# Patient Record
Sex: Male | Born: 1990 | Race: White | Hispanic: No | Marital: Single | Smoking: Never smoker
Health system: Southern US, Community
[De-identification: ages and names within clinical notes are randomized; demographics above are authoritative.]

## PROBLEM LIST (undated history)

## (undated) DIAGNOSIS — R51 Headache: Secondary | ICD-10-CM

## (undated) DIAGNOSIS — N200 Calculus of kidney: Secondary | ICD-10-CM

## (undated) DIAGNOSIS — R55 Syncope and collapse: Secondary | ICD-10-CM

## (undated) HISTORY — PX: NO PAST SURGERIES: SHX2092

## (undated) HISTORY — DX: Headache: R51

## (undated) HISTORY — DX: Syncope and collapse: R55

---
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
## 2013-12-12 ENCOUNTER — Emergency Department (HOSPITAL_COMMUNITY): Payer: Medicaid Other

## 2013-12-12 ENCOUNTER — Encounter (HOSPITAL_COMMUNITY): Payer: Self-pay | Admitting: Emergency Medicine

## 2013-12-12 ENCOUNTER — Emergency Department (HOSPITAL_COMMUNITY)
Admission: EM | Admit: 2013-12-12 | Discharge: 2013-12-13 | Disposition: A | Discharge status: Home / Self Care | Payer: Medicaid Other | Attending: Emergency Medicine | Admitting: Emergency Medicine

## 2013-12-12 DIAGNOSIS — R110 Nausea: Secondary | ICD-10-CM | POA: Insufficient documentation

## 2013-12-12 DIAGNOSIS — R519 Headache, unspecified: Secondary | ICD-10-CM

## 2013-12-12 DIAGNOSIS — R55 Syncope and collapse: Secondary | ICD-10-CM | POA: Insufficient documentation

## 2013-12-12 DIAGNOSIS — R5381 Other malaise: Secondary | ICD-10-CM | POA: Insufficient documentation

## 2013-12-12 DIAGNOSIS — R5383 Other fatigue: Secondary | ICD-10-CM

## 2013-12-12 DIAGNOSIS — R51 Headache: Secondary | ICD-10-CM | POA: Insufficient documentation

## 2013-12-12 DIAGNOSIS — Z87442 Personal history of urinary calculi: Secondary | ICD-10-CM | POA: Insufficient documentation

## 2013-12-12 HISTORY — DX: Calculus of kidney: N20.0

## 2013-12-12 LAB — COMPREHENSIVE METABOLIC PANEL
ALK PHOS: 54 U/L (ref 39–117)
ALT: 24 U/L (ref 0–53)
AST: 23 U/L (ref 0–37)
Albumin: 4.7 g/dL (ref 3.5–5.2)
BUN: 15 mg/dL (ref 6–23)
CO2: 27 meq/L (ref 19–32)
Calcium: 9.8 mg/dL (ref 8.4–10.5)
Chloride: 102 mEq/L (ref 96–112)
Creatinine, Ser: 0.85 mg/dL (ref 0.50–1.35)
GLUCOSE: 94 mg/dL (ref 70–99)
POTASSIUM: 4.1 meq/L (ref 3.7–5.3)
SODIUM: 142 meq/L (ref 137–147)
TOTAL PROTEIN: 7.9 g/dL (ref 6.0–8.3)
Total Bilirubin: 0.5 mg/dL (ref 0.3–1.2)

## 2013-12-12 LAB — CBC WITH DIFFERENTIAL/PLATELET
Basophils Absolute: 0 10*3/uL (ref 0.0–0.1)
Basophils Relative: 0 % (ref 0–1)
Eosinophils Absolute: 0 10*3/uL (ref 0.0–0.7)
Eosinophils Relative: 1 % (ref 0–5)
HCT: 44.7 % (ref 39.0–52.0)
Hemoglobin: 15.8 g/dL (ref 13.0–17.0)
LYMPHS ABS: 2.5 10*3/uL (ref 0.7–4.0)
LYMPHS PCT: 34 % (ref 12–46)
MCH: 30.9 pg (ref 26.0–34.0)
MCHC: 35.3 g/dL (ref 30.0–36.0)
MCV: 87.5 fL (ref 78.0–100.0)
Monocytes Absolute: 0.4 10*3/uL (ref 0.1–1.0)
Monocytes Relative: 5 % (ref 3–12)
NEUTROS PCT: 61 % (ref 43–77)
Neutro Abs: 4.5 10*3/uL (ref 1.7–7.7)
PLATELETS: 241 10*3/uL (ref 150–400)
RBC: 5.11 MIL/uL (ref 4.22–5.81)
RDW: 12.1 % (ref 11.5–15.5)
WBC: 7.4 10*3/uL (ref 4.0–10.5)

## 2013-12-12 LAB — URINALYSIS, ROUTINE W REFLEX MICROSCOPIC
Bilirubin Urine: NEGATIVE
GLUCOSE, UA: NEGATIVE mg/dL
HGB URINE DIPSTICK: NEGATIVE
Ketones, ur: NEGATIVE mg/dL
Leukocytes, UA: NEGATIVE
Nitrite: NEGATIVE
Protein, ur: NEGATIVE mg/dL
Specific Gravity, Urine: 1.023 (ref 1.005–1.030)
Urobilinogen, UA: 0.2 mg/dL (ref 0.0–1.0)
pH: 7.5 (ref 5.0–8.0)

## 2013-12-12 LAB — TROPONIN I: Troponin I: <0.3 ng/mL (ref <0.30)

## 2013-12-12 LAB — D-DIMER, QUANTITATIVE: D-Dimer, Quant: 0.3 ug/mL-FEU (ref 0.00–0.48)

## 2013-12-12 LAB — LIPASE, BLOOD: LIPASE: 27 U/L (ref 11–59)

## 2013-12-12 MED ORDER — SODIUM CHLORIDE 0.9 % IV SOLN
INTRAVENOUS | Status: DC
  Administered 2013-12-12: 19:00:00 via INTRAVENOUS

## 2013-12-12 MED ORDER — HYDROMORPHONE HCL PF 1 MG/ML IJ SOLN: 0.5000 mg | Freq: Once | INTRAMUSCULAR | Status: DC

## 2013-12-12 MED ORDER — GADOBENATE DIMEGLUMINE 529 MG/ML IV SOLN
20.0000 mL | Freq: Once | INTRAVENOUS | Status: AC
  Administered 2013-12-12: 20 mL via INTRAVENOUS

## 2013-12-12 MED ORDER — ONDANSETRON HCL 4 MG/2ML IJ SOLN: 4.0000 mg | Freq: Once | INTRAMUSCULAR | Status: DC

## 2013-12-12 NOTE — ED Provider Notes (Signed)
CSN: [id number]     Arrival date & time 12/12/13  1542 History   First MD Initiated Contact with Patient 12/12/13 1723     Chief Complaint  Patient presents with  . Headache  . Nausea  . Loss of Consciousness     (Consider location/radiation/quality/duration/timing/severity/associated sxs/prior Treatment) Patient is a 23 y.o. male presenting with headaches and syncope. The history is provided by the patient.  Headache Associated symptoms: fatigue and syncope   Associated symptoms: no abdominal pain, no back pain, no congestion, no fever, no neck pain and no numbness   Loss of Consciousness Associated symptoms: headaches   Associated symptoms: no chest pain, no confusion, no fever, no shortness of breath and no weakness    23 year old male brought in by family members. Patient has a history of a benign arachnoid cyst. Patient is followed by neurology headache clinic in [place]. Patient has had similar headaches in the past and felt that maybe it was due to pressures and he was started on hydrochlorothiazide with resolution of the headaches. He's been off that medicine now for a while and headaches have reoccurred. But the new symptom the syncope he's had that happen at least a couple times always unwitnessed. Had one yesterday morning and then had one today. Not able to differentiate whether these could be seizures. But does talk about no incontinence or biting of his tongue. Headache has been persistent. Patient denies any shortness of breath any chest pain any fevers any focal neuro deficits.  Past Medical History  Diagnosis Date  . Kidney stones    History reviewed. No pertinent past surgical history. No family history on file. History  Substance Use Topics  . Smoking status: Never Smoker   . Smokeless tobacco: Not on file  . Alcohol Use: No    Review of Systems  Constitutional: Positive for fatigue. Negative for fever.  HENT: Negative for congestion.   Eyes: Negative for visual  disturbance.  Respiratory: Negative for shortness of breath.   Cardiovascular: Positive for syncope. Negative for chest pain.  Gastrointestinal: Negative for abdominal pain.  Genitourinary: Negative for dysuria and hematuria.  Musculoskeletal: Negative for back pain and neck pain.  Skin: Negative for rash.  Neurological: Positive for syncope and headaches. Negative for weakness and numbness.  Hematological: Does not bruise/bleed easily.  Psychiatric/Behavioral: Negative for confusion.      Allergies  Review of patient's allergies indicates no known allergies.  Home Medications  No current outpatient prescriptions on file. BP 129/59  Pulse 71  Temp(Src) 97.9 F (36.6 C) (Oral)  Resp 18  Wt 205 lb (92.987 kg)  SpO2 94% Physical Exam  Nursing note and vitals reviewed. Constitutional: He is oriented to person, place, and time. He appears well-developed and well-nourished. No distress.  HENT:  Head: Normocephalic and atraumatic.  Mouth/Throat: Oropharynx is clear and moist.  Eyes: Conjunctivae and EOM are normal. Pupils are equal, round, and reactive to light.  Neck: Normal range of motion.  Cardiovascular: Normal rate, regular rhythm, normal heart sounds and intact distal pulses.   Pulmonary/Chest: Effort normal and breath sounds normal. No respiratory distress.  Abdominal: Soft. Bowel sounds are normal. There is no tenderness.  Genitourinary: Penis normal.  Musculoskeletal: Normal range of motion. He exhibits no edema.  Neurological: He is alert and oriented to person, place, and time. No cranial nerve deficit. He exhibits normal muscle tone. Coordination normal.  Skin: Skin is warm. No rash noted.    ED Course  Procedures (including critical  care time) Labs Review Labs Reviewed  URINALYSIS, ROUTINE W REFLEX MICROSCOPIC - Abnormal; Notable for the following:    APPearance CLOUDY (*)    All other components within normal limits  CBC WITH DIFFERENTIAL  COMPREHENSIVE  METABOLIC PANEL  LIPASE, BLOOD  TROPONIN I  D-DIMER, QUANTITATIVE   Imaging Review Dg Chest 2 View  12/12/2013   CLINICAL DATA:  Headache and nausea.  EXAM: CHEST  2 VIEW  COMPARISON:  None.  FINDINGS: Heart, mediastinal, and hilar contours are within normal limits. Lung volumes are normal. There is a possible small airspace opacity in the right upper lung seen on the frontal view. This is not definite. Otherwise, the lung fields are clear. There is no pleural effusion or pneumothorax. The trachea is midline. The visualized bones are normal and the upper abdomen abdominal bowel gas pattern is nonobstructive.  IMPRESSION: Cannot exclude small early airspace opacity or focal atelectasis in the right upper lung. Alternatively, this small asymmetric opacity could be due to overlap of normal vascular structures. If there is clinical concern for pneumonia, consider followup chest radiograph and 24-48 hr.   Electronically Signed   By: Britta MccreedySusan  Turner M.D.   On: 12/12/2013 19:18   Ct Head Wo Contrast  12/12/2013   CLINICAL DATA:  HEADACHE NAUSEA SYNCOPE HISTORY OF CYST IN BRAIN  EXAM: CT HEAD WITHOUT CONTRAST  TECHNIQUE: Contiguous axial images were obtained from the base of the skull through the vertex without intravenous contrast.  COMPARISON:  None.  FINDINGS: Minimal right maxillary sinus inflammatory change. Remainder of the sinuses clear. Calvarium intact.  No hemorrhage, extra-axial fluid, or infarct. No hydrocephalus. Cystic structure extending from the posterior central occipital bone over the vermis measuring 2 cm in with maximally. This is consistent with a developmental benign abnormality.  IMPRESSION: No acute findings.   Electronically Signed   By: Esperanza Heiraymond  Rubner M.D.   On: 12/12/2013 19:02   Mr Laqueta JeanBrain W JYWo Contrast  12/12/2013   CLINICAL DATA:  Headache, nausea, dizziness, and multiple syncopal episodes.  EXAM: MRI HEAD WITHOUT AND WITH CONTRAST  TECHNIQUE: Multiplanar, multiecho pulse sequences of  the brain and surrounding structures were obtained without and with intravenous contrast.  CONTRAST:  20mL MULTIHANCE GADOBENATE DIMEGLUMINE 529 MG/ML IV SOLN  COMPARISON:  Prior CT performed earlier on the same day.  FINDINGS: The CSF containing spaces are within normal limits for patient age. No focal parenchymal signal abnormality is identified. No mass lesion, midline shift, or extra-axial fluid collection. Ventricles are normal in size without evidence of hydrocephalus.  Cystic structure extending from the posterior fossa over the cerebellar vermis in the region of the straight sinus follows CSF signal intensity on all pulse sequences and does not enhance on postcontrast images, most compatible with a benign arachnoid cyst. This cyst measures approximately 2.5 x 1.7 cm and greatest diameter.  No diffusion-weighted signal abnormality is identified to suggest acute intracranial infarct. Gray-white matter differentiation is maintained. Normal flow voids are seen within the intracranial vasculature. No intracranial hemorrhage identified.  The cervicomedullary junction is normal. Pituitary gland is within normal limits. Pituitary stalk is midline. The globes and optic nerves demonstrate a normal appearance with normal signal intensity.  No abnormal enhancement identified.  The bone marrow signal intensity is normal. Calvarium is intact. Visualized upper cervical spine is within normal limits.  Scalp soft tissues are unremarkable.  Paranasal sinuses are clear.  No mastoid effusion.  IMPRESSION: 1. No acute intracranial infarct or other abnormality identified within the  brain. No abnormal enhancement. 2. Benign arachnoid cyst within the posterior fossa as above. No further follow-up recommended for this lesion.   Electronically Signed   By: Rise Mu M.D.   On: 12/12/2013 23:31    EKG Interpretation   None       MDM   Final diagnoses:  Syncope  Headache    Patient with recurrent headaches  and syncope. Workup here to include a MRI of the brain without any significant changes. Patient with cardiac monitoring here without any arrhythmia. Basic labs without any significant abnormalities. Chest x-ray negative as well head CT -2. Patient does have a history of benign arachnoid cyst. It is in the posterior fossa. No significant changes with that. Have given patient referral to neurology for EEG have also recommended cardiology followup once neurology workup is negative. Patient will return for new or worse symptoms.    Shelda Jakes, MD 12/12/13 [phone]

## 2013-12-12 NOTE — ED Notes (Signed)
Pts mother, Misty StanleyLisa, would like to be contacted with updates: [phone].

## 2013-12-12 NOTE — Discharge Instructions (Signed)
Return for any new or worse symptoms. Make an appointment to followup with neurology either here locally with [facility] neurology referral information provided above. Or contact of the headache clinic and neurology team has been following you in [place]. EEG of the brain would be appropriate to rule out a seizure. Once neurology workup is negative and cardiology referral would be appropriate for additional workup. Finding a primary care Dr. would be helpful or using the clinic that she currently been using. Resource guide provided below to help you with resources in the area. Due to the passing out episodes avoid any dangerous heights or situations that could be a problem that he passed out.   Emergency Department Resource Guide 1) Find a Doctor and Pay Out of Pocket Although you won't have to find out who is covered by your insurance plan, it is a good idea to ask around and get recommendations. You will then need to call the office and see if the doctor you have chosen will accept you as a new patient and what types of options they offer for patients who are self-pay. Some doctors offer discounts or will set up payment plans for their patients who do not have insurance, but you will need to ask so you aren't surprised when you get to your appointment.  2) Contact Your Local Health Department Not all health departments have doctors that can see patients for sick visits, but many do, so it is worth a call to see if yours does. If you don't know where your local health department is, you can check in your phone book. The CDC also has a tool to help you locate your state's health department, and many state websites also have listings of all of their local health departments.  3) Find a Walk-in Clinic If your illness is not likely to be very severe or complicated, you may want to try a walk in clinic. These are popping up all over the country in pharmacies, drugstores, and shopping centers. They're usually  staffed by nurse practitioners or physician assistants that have been trained to treat common illnesses and complaints. They're usually fairly quick and inexpensive. However, if you have serious medical issues or chronic medical problems, these are probably not your best option.  No Primary Care Doctor: - Call Health Connect at  [phone] - they can help you locate a primary care doctor that  accepts your insurance, provides certain services, etc. - Physician Referral Service- [phone]  Chronic Pain Problems: Organization         Address  Phone   Notes  [facility]  [phone] Patients need to be referred by their primary care doctor.   Medication Assistance: Organization         Address  Phone   Notes  [facility] Medication [facility] [address] [place] [place], [place] [address] [phone] --Must be a resident of [facility] -- Must have NO insurance coverage whatsoever (no Medicaid/ Medicare, etc.) -- The pt. MUST have a primary care doctor that directs their care regularly and follows them in the community   [facility]ist  [phone]   [employer]  [phone]    Agencies that provide inexpensive medical care: Organization         Address  Phone   Notes  [facility]  [phone]   [facility]    [phone]   [facility] [address] [place], [place] [address] [phone]   [facility] [address] [place] [phone]   Planned Parenthood    [phone]   [facility]    [phone]   [facility]  [address], [place] Phone:  [phone], Fax:  [phone] Hours of Operation:  9 am - 6 pm, M-F.  Also accepts Medicaid/Medicare and self-pay.  [facility]  [address], [place] Phone: [phone], Fax: [phone]. Hours of  Operation:  8:30 am - 5:30 pm, M-F.  Also accepts Medicaid and self-pay.  [facility] [place] [address], [place] Phone: [phone]   [facility] [address] Natasha Bence [place], [place] [phone], Ext. [phone] Mondays & Thursdays: 7-9 AM.  First 15 patients are seen on a first come, first serve basis.    Medicaid-accepting [facility] Providers:  Organization         Address  Phone   Notes  [facility] [address],  [phone] Also accepts self-pay patients.  [facility] [address] Laurell Josephs [place], [place]  [phone]   [facility] [address], [place] [phone]   [facility] [address] [place] [phone]   Renaye Rakers [address], [place]   [phone] Only accepts [place] Access [place] patients after they have their name applied to their card.   Self-Pay (no insurance) in [facility]:  Organization         Address  Phone   Notes  Sickle Cell Patients, [facility] Medicine [address] [place], [place] [phone]   [facility] [address] [place], [place] [phone]   [facility] [place]  [address], [place] [phone]   [facility]/Dr. Osei-Bonsu  [address] [place] or [address], [place] [phone] Phone number for both [place] and [place] locations is the same.  [facility] [address] [place] [phone]   [facility] [address] [place] or [address] [phone]   [facility] [address] [place] [phone], phone; [phone], fax Sees patients 1st and 3rd Saturday of every month.  Must not qualify for public or private insurance (i.e. Medicaid, Medicare,  [place] Health Choice, Veterans' Benefits)  Household income should be no more than 200% of the poverty level The clinic cannot treat you if you are pregnant or think you are pregnant  Sexually transmitted diseases are not treated at the clinic.    Dental Care: Organization         Address  Phone  Notes  [facility] [address] [place], [place] [phone] Accepts children up to age 36 who are enrolled in [place] or [place] Health Choice; pregnant women with a Medicaid card; and children who have applied for Medicaid or [place] Health Choice, but were declined, whose parents can pay a reduced fee at time of service.  [facility] Department of [facility]  [address], [place] [phone] Accepts children up to age 45 who are enrolled in [place] or [place]  Health Choice; pregnant women with a Medicaid card; and children who have applied for Medicaid or [place] Health Choice, but were declined, whose parents can pay a reduced fee at time of service.  [facility]  [address], [place] [phone] Patients are seen by appointment only. Walk-ins are not accepted. [facility] will see patients 21 years of age and older. Monday - Tuesday (8am-5pm) Most Wednesdays (8:30-5pm) $30 per visit, cash only  [facility]  [address], [facility] [place] [phone] Patients are seen by appointment only. Walk-ins are not accepted. [facility] will see patients 21 years of age and older. One Wednesday Evening (Monthly: Volunteer Based).  $30 per visit, cash only  [employer] [facility]  [phone] for adults; Children under age 684, call Graduate Pediatric Dentistry at [phone]. Children aged 84-14, please call [phone] to request a pediatric application.  Dental services are provided in all areas of dental care including fillings, crowns and bridges,  complete and partial dentures, implants, gum treatment, root canals, and extractions. Preventive care is also provided. Treatment is provided to both adults and children. Patients are selected via a lottery and there is often a waiting list.   [facility] [address], [place]  [phone] [email or web address]   [facility] [address], [place], [place] [phone], Ext. [phone] Second and Fourth Thursday of each month, opens at 6:30 AM; Clinic ends at 9 AM.  Patients are seen on a first-come first-served basis, and a limited number are seen during each clinic.   [facility]  [address], [place], [place] [phone]   Eligibility Requirements You must have lived in [place], [place], or [place] counties for at least the last three months.   You cannot be eligible for state or federal sponsored [place] Cityhealthcare insurance, including CIGNAVeterans Administration, [place]icaid, or [employer]icare.   You generally cannot be eligible for healthcare insurance through your employer.    How to apply: Eligibility screenings are held every Tuesday and Wednesday afternoon from 1:00 pm until 4:00 pm. You do not need an appointment for the interview!  [facility] [address], [place], [place] [phone]   [facility]  [phone]   [facility]  [phone]   [facility]  [phone]    Behavioral Health Resources in the Community: Intensive Outpatient Programs Organization         Address  Phone  Notes  [facility] [address], [place], [place] [phone]   [facility] Outpatient [address], [place], [place] [phone]   [facility] [address], [place], [place]  [phone]   [facility] [address],  [place], [place] [phone] or [phone]   Substance Abuse Resources Organization          Address  Phone  Notes  [facility] Drug Services  [phone]   [facility]  [phone]   [facility]  [phone]   [facility]  [phone]   Residential & Outpatient Substance Abuse Program  [phone]   Psychological Services Organization         Address  Phone  Notes  [facility]  [phone]- [phone]   [facility]  [phone]- [phone]   [facility] [address], [place] [phone] or [phone]  Mobile Crisis Teams Organization         Address  Phone  Notes  [facility], Mobile Crisis Care Unit  [phone]   [facility]  [address] [place], [place]   [place] Levels [address], [place] [phone]    Self-Help/Support Groups Organization         Address  Phone             Notes  [facility] [place] - variety of support groups  Decker Call for more information  Narcotics Anonymous (NA), [facility] [address], [employer] [place]  2 meetings at this location   Special educational needs teacher         Address  Phone  Notes  ASAP Residential Treatment [place],    [place]  [phone]   [facility]  [address] [place] [phone], [place], [place]   [place], [place] [phone] Admissions: 8am-3pm M-F  Incentives Substance [place] [address]    [place], [place] [phone]   [facility] [address] [place], [place], [place]   [facility] [address]  [place], [place]   [facility] - Intensive Outpatient [address], [place], [place]   [facility] ([place].,  [place], [place] [phone] or [phone]   [facility] (RTS) [address], [place], [place] Accepts Medicaid  [facility] [place] [address],  [place] [phone] Substance Abuse/Addiction Treatment   [facility] Organization         Address  Phone  Notes  [facility]  [phone]   Domenic Schwab, PhD [address] [place], [place]   [phone] or [phone]   [place], [place] [phone]   [facility] [address], [place], [place] [phone] Insurance/Medicaid/sponsorship through [facility] and Families [address] [place]                                    [place], [place] [phone] [place] [address], [place] [phone]    Dr. Adele Schilder  [phone]   [facility] Dept. 1) [address], [place] 2) [place] 3)  [address], [place] [phone]  [phone]   [place] [phone] or [phone] (After Hours)

## 2013-12-12 NOTE — ED Notes (Signed)
This RN called MRI to determine how much longer until patient will be transported to MRI and was informed there is one patient ahead of this patient and it will be about 30-40 minutes. Family and patient updated.

## 2013-12-12 NOTE — ED Notes (Signed)
Pt refusing pain medication. He reports his neurologist told him the "pain" he is having is not actually pain but his brain is telling him he is in pain and he doesn't want to "train" his brain to respond to pain medication.

## 2013-12-12 NOTE — ED Notes (Signed)
Dr. Zackowski at bedside  

## 2013-12-12 NOTE — ED Notes (Addendum)
Pt presents to department for evaluation of headache, nausea, dizziness and multiple syncopal episodes. States he passed out yesterday and today, states he woke up on floor today. 5/10 pain at the time. History of cyst on brain. Pt is alert and oriented x4. No neurological deficits noted.

## 2013-12-12 NOTE — ED Notes (Signed)
This RN called MRI to check on status of patient and was informed patient is on the table now and it will be about another 15 minutes until pt is brought back. Family at bedside and updated.

## 2013-12-13 NOTE — ED Notes (Signed)
Pt A&Ox4, ambulatory at discharge. Verbalizing no complaints at this time.

## 2013-12-17 ENCOUNTER — Ambulatory Visit (INDEPENDENT_AMBULATORY_CARE_PROVIDER_SITE_OTHER): Payer: Medicaid Other

## 2013-12-17 ENCOUNTER — Telehealth: Payer: Self-pay | Admitting: Neurology

## 2013-12-17 DIAGNOSIS — R55 Syncope and collapse: Secondary | ICD-10-CM

## 2013-12-17 NOTE — Procedures (Signed)
    History:   Brett Vincent is a 23 year old patient with a history of headaches. The patient recently has had some episodes of syncope as well, and he is being evaluated for possible seizures. The patient has a benign posterior fossa arachnoid cyst.  This is a routine EEG. No skull defects are noted. The patient is on no medications.  EEG classification: Normal awake and drowsy  Description of the recording: The background rhythms of this recording consists of a fairly well modulated medium amplitude alpha rhythm of 10 Hz that is reactive to eye opening and closure. As the record progresses, the patient appears to remain in the waking state throughout the recording. Photic stimulation was performed, resulting in a bilateral and symmetric photic driving response. Hyperventilation was also performed, resulting in a minimal buildup of the background rhythm activities without significant slowing seen. Toward the end of the recording, the patient enters the drowsy state with slight symmetric slowing seen. Occasionally during periods of brief drowsiness, the patient may have hypersynchrony of parasagittal activity lasting 1-2 seconds. The patient never enters stage II sleep. At no time during the recording does there appear to be evidence of spike or spike wave discharges or evidence of focal slowing. EKG monitor shows no evidence of cardiac rhythm abnormalities with a heart rate of 72.  Impression: This is a normal EEG recording in the waking and drowsy state. No evidence of ictal or interictal discharges are seen.

## 2013-12-17 NOTE — Telephone Encounter (Signed)
I called patient. The EEG study was unremarkable. The patient was seen in the emergency room, does not have a local physician. The patient will need a new patient evaluation soon.

## 2013-12-18 ENCOUNTER — Telehealth: Payer: Self-pay | Admitting: *Deleted

## 2013-12-18 NOTE — Telephone Encounter (Signed)
Patient needs new pt eval. Left msg for him to call the office to schedule.

## 2013-12-19 ENCOUNTER — Encounter: Payer: Self-pay | Admitting: Neurology

## 2013-12-19 ENCOUNTER — Ambulatory Visit (INDEPENDENT_AMBULATORY_CARE_PROVIDER_SITE_OTHER): Payer: Medicaid Other | Admitting: Neurology

## 2013-12-19 VITALS — BP 125/72 | HR 79 | Ht 70.0 in | Wt 205.0 lb

## 2013-12-19 DIAGNOSIS — R51 Headache: Secondary | ICD-10-CM

## 2013-12-19 DIAGNOSIS — R55 Syncope and collapse: Secondary | ICD-10-CM

## 2013-12-19 DIAGNOSIS — R519 Headache, unspecified: Secondary | ICD-10-CM | POA: Insufficient documentation

## 2013-12-19 HISTORY — DX: Syncope and collapse: R55

## 2013-12-19 NOTE — Progress Notes (Signed)
Reason for visit: Headache, syncope  Brett Vincent is a 23 y.o. male  History of present illness:  Brett Vincent is a 23 year old right-handed white male with a history of headaches that began around April of 2013. The headaches are associated with a pressure sensation in the head, and also had a stabbing quality all over the head, front back, and side to side. The patient may have nausea and dizziness with the headache as well. The patient has had an MRI the brain done in April 2013 showing a posterior fossa arachnoid cyst. The patient seen by neurosurgery, but they did not recommend any surgical procedures regarding this. The patient was seen by headache specialist, and he was placed on hydrochlorothiazide. This seemed to help the headache. The patient off the medication several months ago, and the headaches have recurred 2 weeks ago. The patient has gone back on the hydrochlorothiazide last week, but the headaches persist The headaches are again associated with dizziness, but the patient has had at least 2 syncopal events, one occurring on the 10th of February, 2015, and one on the 11th. The first episode was associated with loss of consciousness for about 5 hours. The patient did not bite his tongue or lose control of the bowels or the bladder. The patient did have vomiting with this episode. The patient had a more brief event on February 11 lasting 5 minutes. The patient did not have anyone around when these events occurred. The patient reports no focal numbness or weakness of the face, arms, or legs. The patient reports no chest pain or palpitations of the heart. The patient has undergone an EEG study that is unremarkable. A repeat MRI of the brain was done on the 11th of February, 2015. This study was reviewed on line. No direct compression of the ventricular system, or brain stem was noted. The patient is sent to this office for further evaluation.  Past Medical History  Diagnosis Date  . Kidney  stones   . Headache(784.0)   . Syncope and collapse 12/19/2013    History reviewed. No pertinent past surgical history.  Family History  Problem Relation Age of Onset  . Cancer Mother     follicular lymphoma  . COPD Father   . Heart disease Father     Social history:  reports that he has never smoked. He has never used smokeless tobacco. He reports that he does not drink alcohol or use illicit drugs.  Medications:  No current outpatient prescriptions on file prior to visit.   No current facility-administered medications on file prior to visit.     No Known Allergies  ROS:  Out of a complete 14 system review of symptoms, the patient complains only of the following symptoms, and all other reviewed systems are negative.  Fatigue Hearing loss, ringing in the ears Eye redness Flushing Nausea, vomiting Insomnia, daytime sleepiness Walking difficulty, coordination problems Dizziness, headache, syncope Agitation  Blood pressure 125/72, pulse 79, height 5\' 10"  (1.778 m), weight 205 lb (92.987 kg).  Physical Exam  General: The patient is alert and cooperative at the time of the examination.  Eyes: Pupils are equal, round, and reactive to light. Discs are flat bilaterally.  Neck: The neck is supple, no carotid bruits are noted.  Respiratory: The respiratory examination is clear.  Cardiovascular: The cardiovascular examination reveals a regular rate and rhythm, no obvious murmurs or rubs are noted.  Skin: Extremities are without significant edema.  Neurologic Exam  Mental status: The  patient is alert and oriented x 3 at the time of the examination. The patient has apparent normal recent and remote memory, with an apparently normal attention span and concentration ability.  Cranial nerves: Facial symmetry is present. There is good sensation of the face to pinprick and soft touch bilaterally. The strength of the facial muscles and the muscles to head turning and shoulder  shrug are normal bilaterally. Speech is well enunciated, no aphasia or dysarthria is noted. Extraocular movements are full. Visual fields are full. The tongue is midline, and the patient has symmetric elevation of the soft palate. No obvious hearing deficits are noted.  Motor: The motor testing reveals 5 over 5 strength of all 4 extremities. Good symmetric motor tone is noted throughout.  Sensory: Sensory testing is intact to pinprick, soft touch, vibration sensation, and position sense on all 4 extremities. No evidence of extinction is noted.  Coordination: Cerebellar testing reveals good finger-nose-finger and heel-to-shin bilaterally.  Gait and station: Gait is normal. Tandem gait is normal. Romberg is negative. No drift is seen.  Reflexes: Deep tendon reflexes are symmetric and normal bilaterally. Toes are downgoing bilaterally.   Assessment/Plan:  1. Headache, possible migraine  2. Posterior fossa arachnoid cyst  3. Episodic syncope  At this point, I have recommended treatment for migraine headache including Topamax or Zonegran. The patient does not wish to consider these medications. The patient will be referred to cardiology for evaluation of the syncopal events. The patient will followup in 3-4 months. If the patient decides he does want medical therapy for the headache, he is to contact our office. The patient seems to be convinced that the cyst is the etiology of all symptoms. Certainly, and arachnoid cysts may be symptomatic at times, but this cyst does not appear to be exerting pressure on the brain or brainstem.  Marlan Palau. Keith Jera Headings MD 12/19/2013 6:18 PM  [facility] [address] [place], [place] [id number]  Phone [phone] Fax [phone]

## 2013-12-19 NOTE — Patient Instructions (Signed)
Syncope  Syncope is a fainting spell. This means the person loses consciousness and drops to the ground. The person is generally unconscious for less than 5 minutes. The person may have some muscle twitches for up to 15 seconds before waking up and returning to normal. Syncope occurs more often in elderly people, but it can happen to anyone. While most causes of syncope are not dangerous, syncope can be a sign of a serious medical problem. It is important to seek medical care.   CAUSES   Syncope is caused by a sudden decrease in blood flow to the brain. The specific cause is often not determined. Factors that can trigger syncope include:   Taking medicines that lower blood pressure.   Sudden changes in posture, such as standing up suddenly.   Taking more medicine than prescribed.   Standing in one place for too long.   Seizure disorders.   Dehydration and excessive exposure to heat.   Low blood sugar (hypoglycemia).   Straining to have a bowel movement.   Heart disease, irregular heartbeat, or other circulatory problems.   Fear, emotional distress, seeing blood, or severe pain.  SYMPTOMS   Right before fainting, you may:   Feel dizzy or lightheaded.   Feel nauseous.   See all white or all black in your field of vision.   Have cold, clammy skin.  DIAGNOSIS   Your caregiver will ask about your symptoms, perform a physical exam, and perform electrocardiography (ECG) to record the electrical activity of your heart. Your caregiver may also perform other heart or blood tests to determine the cause of your syncope.  TREATMENT   In most cases, no treatment is needed. Depending on the cause of your syncope, your caregiver may recommend changing or stopping some of your medicines.  HOME CARE INSTRUCTIONS   Have someone stay with you until you feel stable.   Do not drive, operate machinery, or play sports until your caregiver says it is okay.   Keep all follow-up appointments as directed by your  caregiver.   Lie down right away if you start feeling like you might faint. Breathe deeply and steadily. Wait until all the symptoms have passed.   Drink enough fluids to keep your urine clear or pale yellow.   If you are taking blood pressure or heart medicine, get up slowly, taking several minutes to sit and then stand. This can reduce dizziness.  SEEK IMMEDIATE MEDICAL CARE IF:    You have a severe headache.   You have unusual pain in the chest, abdomen, or back.   You are bleeding from the mouth or rectum, or you have black or tarry stool.   You have an irregular or very fast heartbeat.   You have pain with breathing.   You have repeated fainting or seizure-like jerking during an episode.   You faint when sitting or lying down.   You have confusion.   You have difficulty walking.   You have severe weakness.   You have vision problems.  If you fainted, call your local emergency services (911 in [place] Do not drive yourself to the hospital.   MAKE SURE YOU:   Understand these instructions.   Will watch your condition.   Will get help right away if you are not doing well or get worse.  Document Released: 10/18/2005 Document Revised: 04/18/2012 Document Reviewed: 12/17/2011  Exit[employer] Patient Information 2014 [employer].

## 2014-01-09 ENCOUNTER — Ambulatory Visit: Payer: Medicaid Other | Admitting: Cardiology

## 2014-02-08 ENCOUNTER — Encounter: Payer: Self-pay | Admitting: Cardiology

## 2014-02-22 ENCOUNTER — Encounter: Payer: Self-pay | Admitting: Cardiology

## 2015-03-26 IMAGING — CT CT HEAD W/O CM
2 series · 16 of 30 positions shown, 18 images · non-contrast
Comparison: None.

CLINICAL DATA: HEADACHE NAUSEA SYNCOPE HISTORY OF CYST IN BRAIN

EXAM:
CT HEAD WITHOUT CONTRAST
TECHNIQUE: Contiguous axial images were obtained from the base of the skull
through the vertex without intravenous contrast.

[Series 2: head w/o · axial · non-contrast · 0.49mm/px · z∈[+71,+181]mm · 8 of 30 slices shown, 10 images]
[im 4/30  brain]
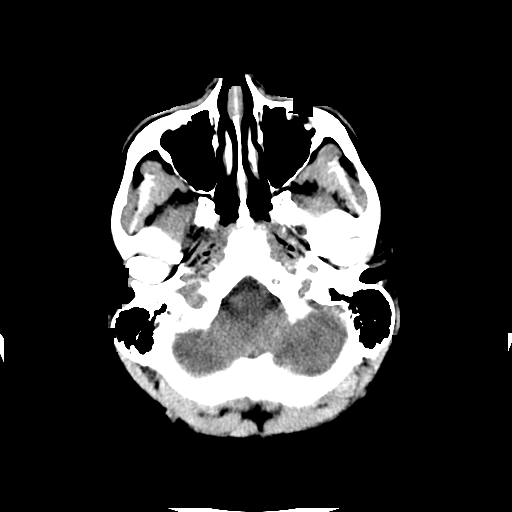
[im 4/30  bone]
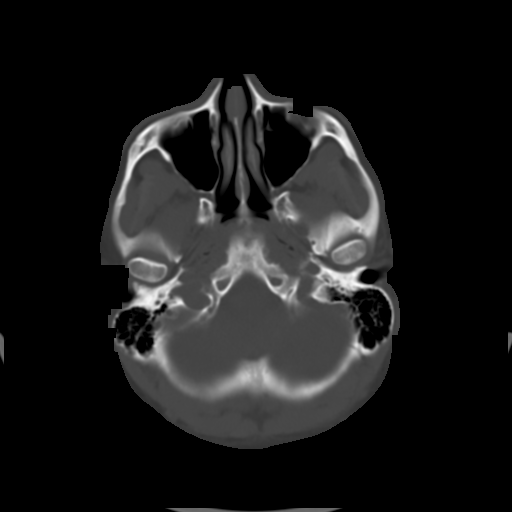
[im 7/30  brain]
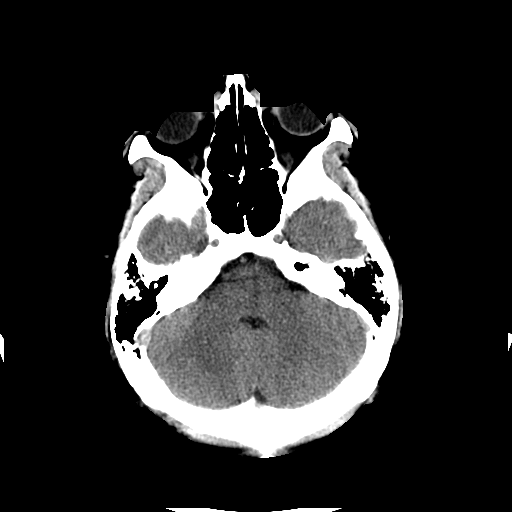
[im 10/30  brain]
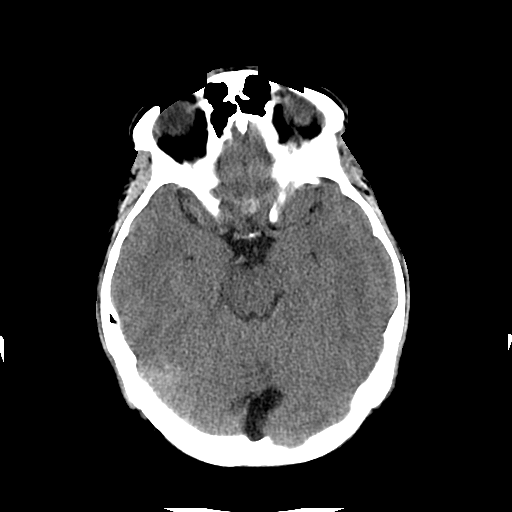
[im 13/30  brain]
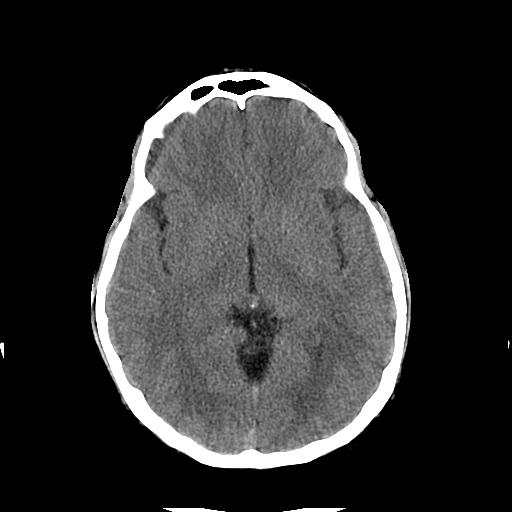
[im 17/30  brain]
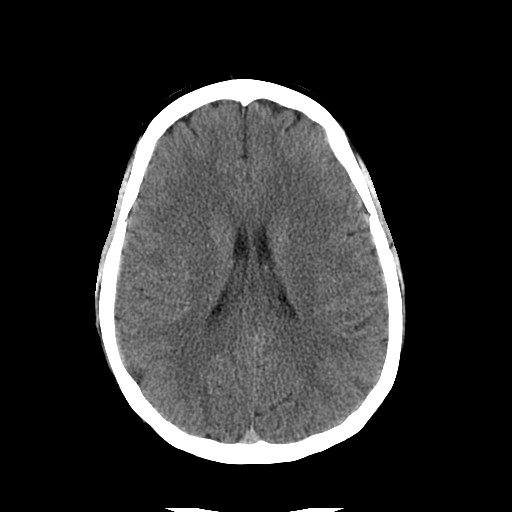
[im 17/30  bone]
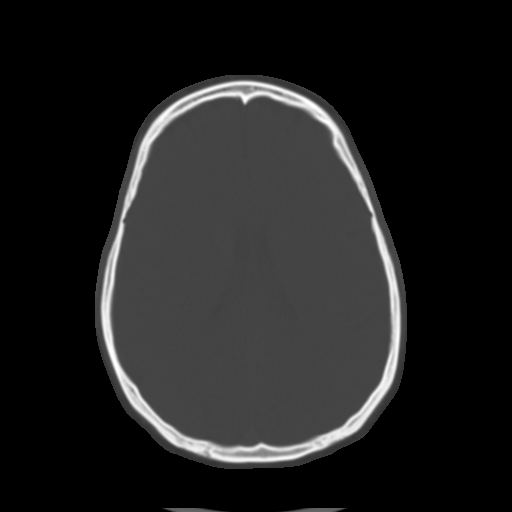
[im 20/30  brain]
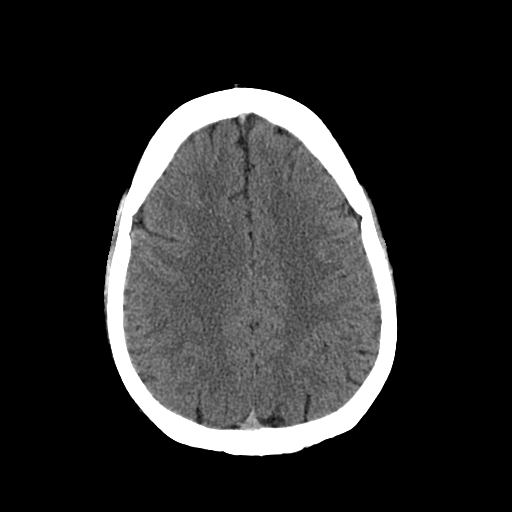
[im 23/30  brain]
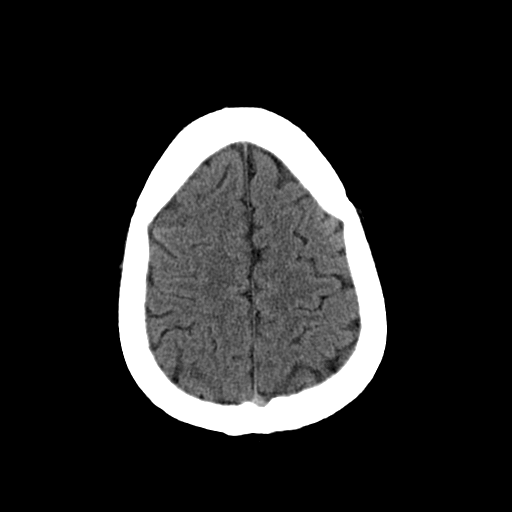
[im 26/30  brain]
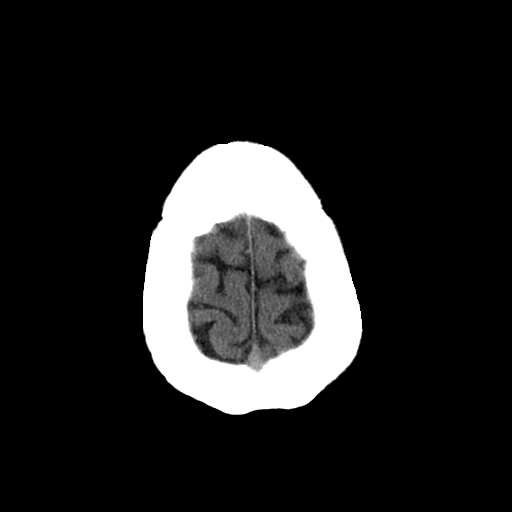

[Series 3: head w/o bone · axial · non-contrast · 0.49mm/px · z∈[+71,+183]mm · 8 of 59 slices shown]
[im 7/59  bone]
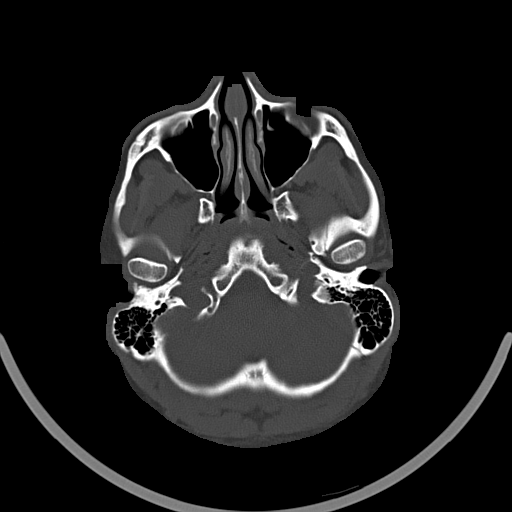
[im 13/59  bone]
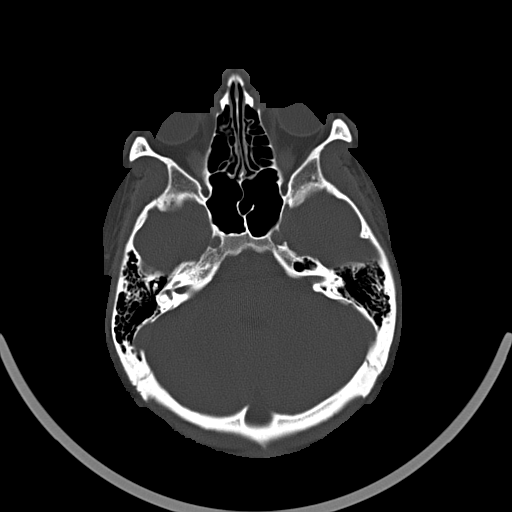
[im 19/59  bone]
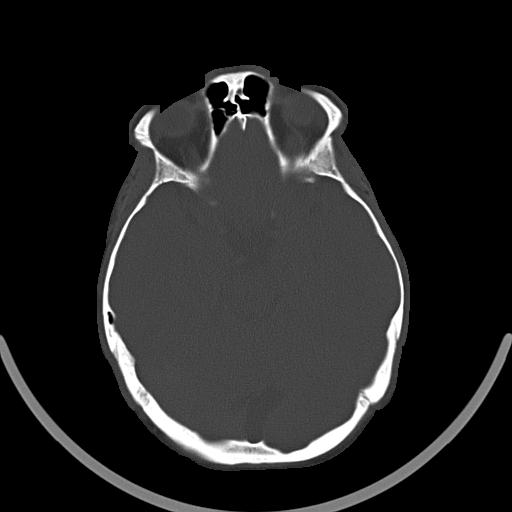
[im 25/59  bone]
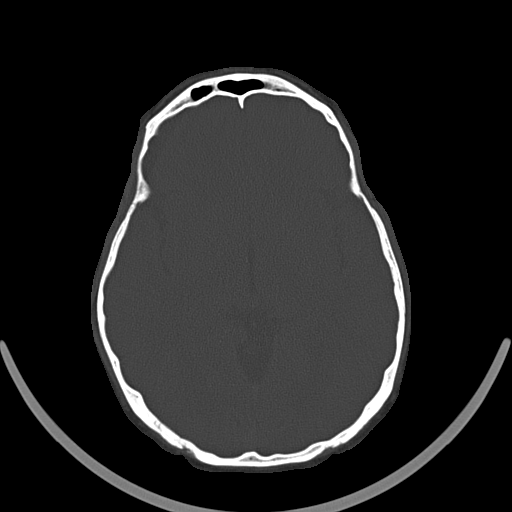
[im 34/59  bone]
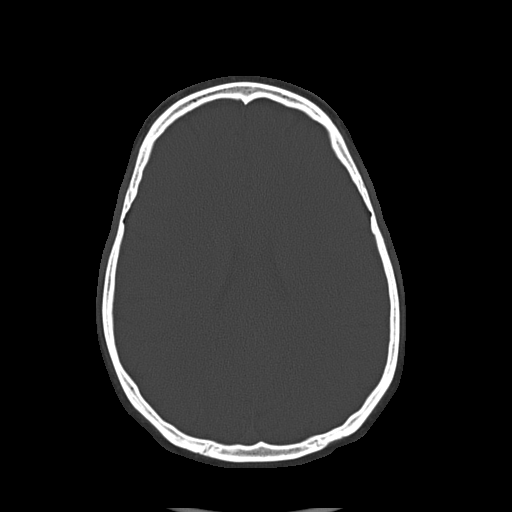
[im 40/59  bone]
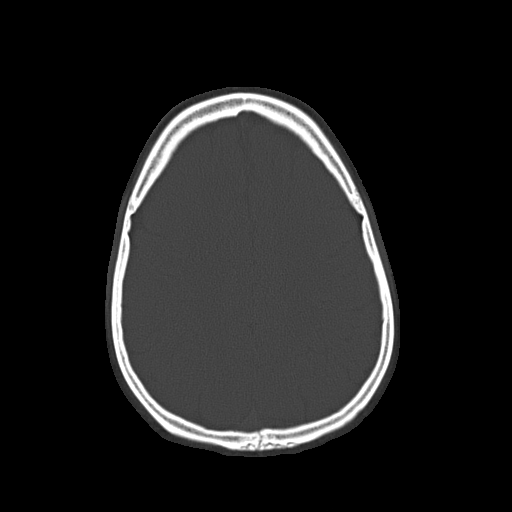
[im 46/59  bone]
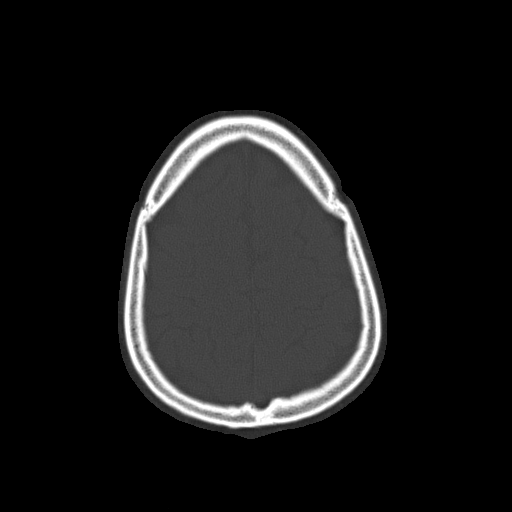
[im 52/59  bone]
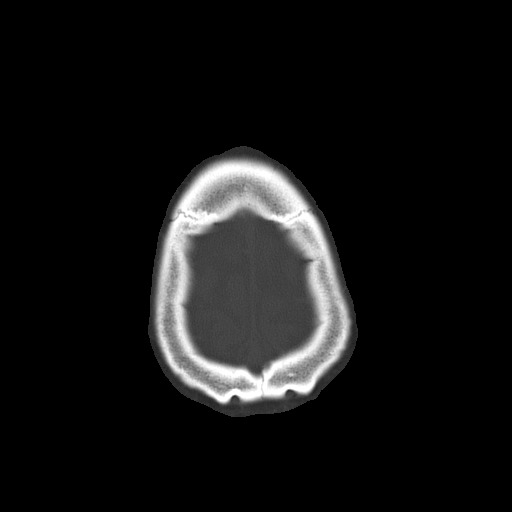

[16 of 30 positions shown; findings below may reference images not displayed]

FINDINGS: Minimal right maxillary sinus inflammatory change. Remainder of the
sinuses clear. Calvarium intact.

No hemorrhage, extra-axial fluid, or infarct. No hydrocephalus.
Cystic structure extending from the posterior central occipital bone
over the vermis measuring 2 cm in with maximally. This is consistent
with a developmental benign abnormality.
IMPRESSION: No acute findings.

## 2015-03-26 IMAGING — CR DG CHEST 2V
2 series · 2 of 2 positions shown · non-contrast
Comparison: None.

CLINICAL DATA: Headache and nausea.

EXAM:
CHEST  2 VIEW

[w chest pa]
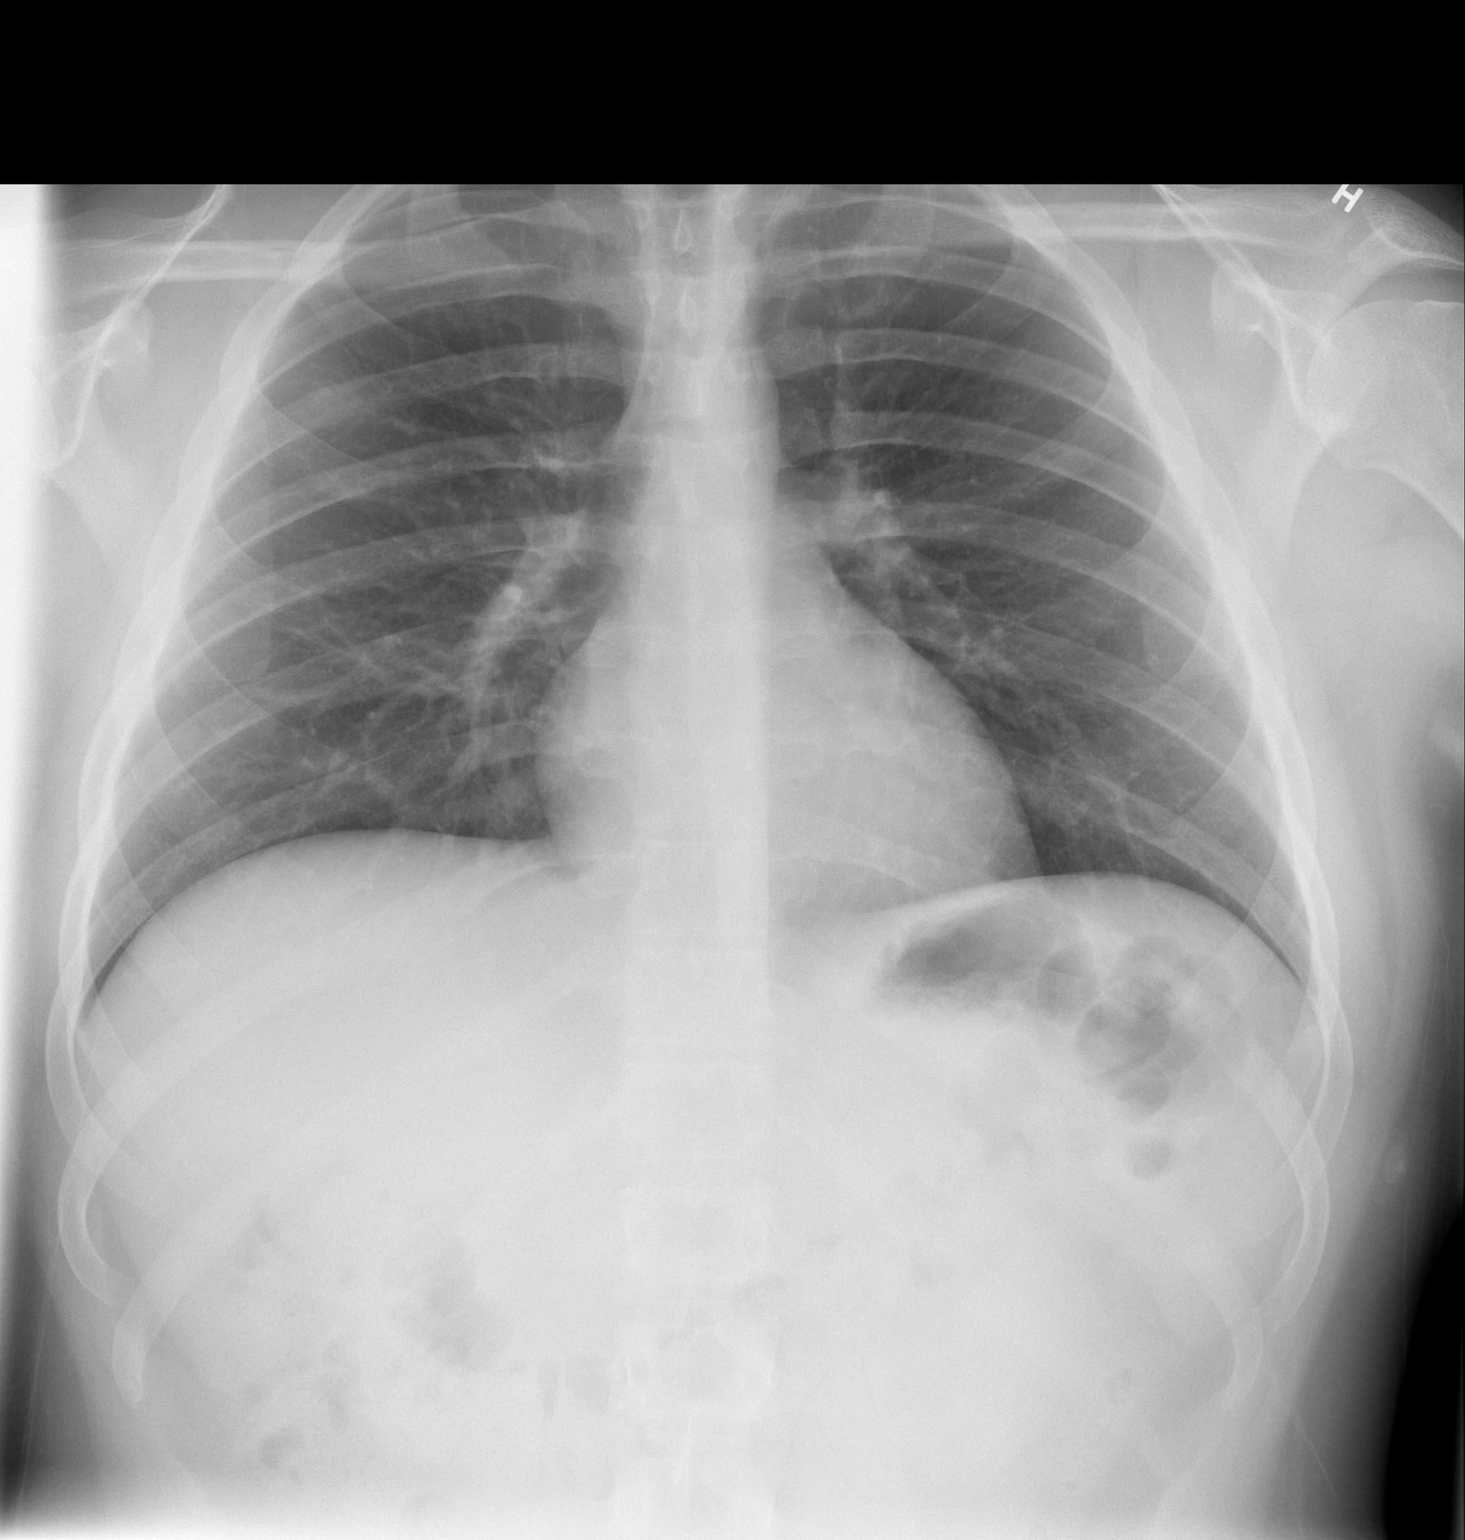

[w chest lat]
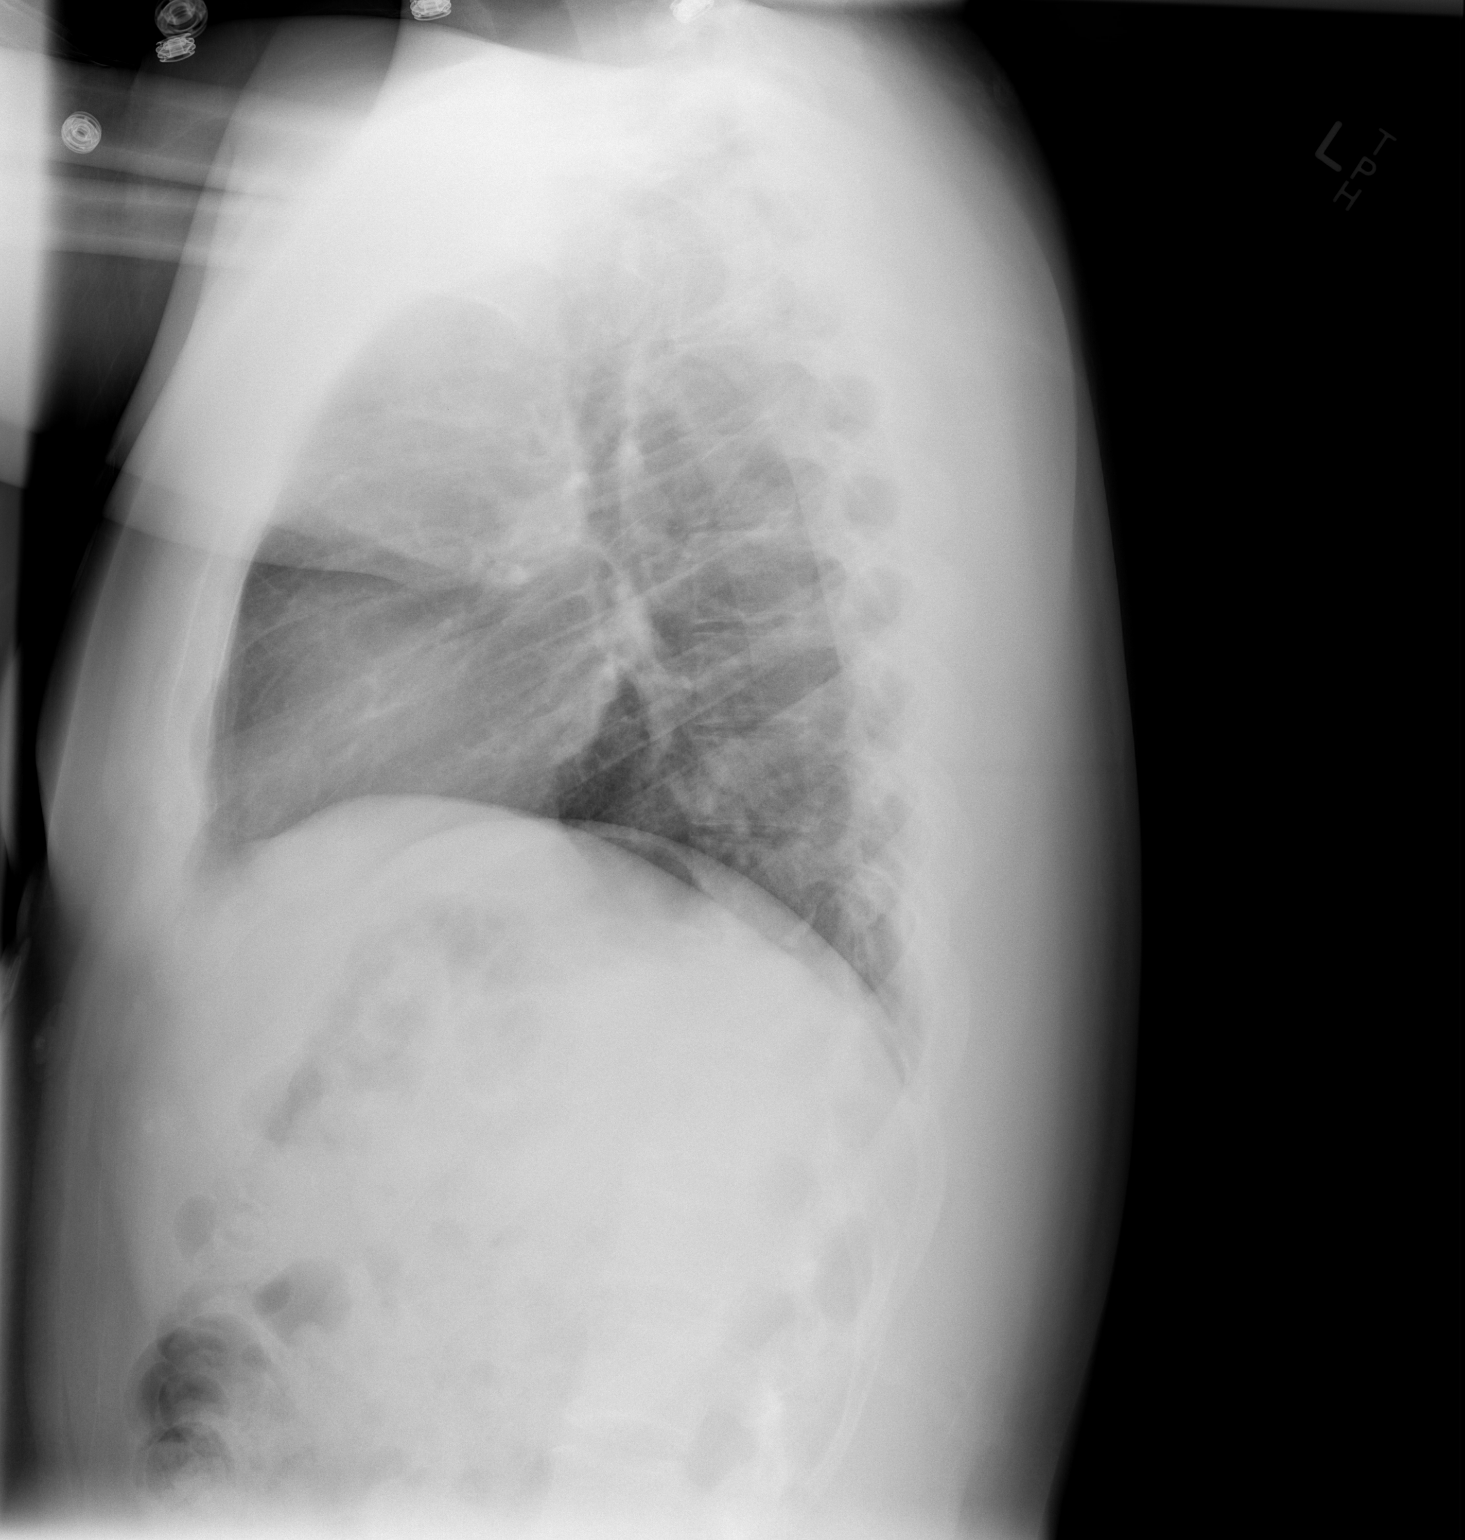

[2 of 2 positions shown; findings below may reference images not displayed]

FINDINGS: Heart, mediastinal, and hilar contours are within normal limits.
Lung volumes are normal. There is a possible small airspace opacity
in the right upper lung seen on the frontal view. This is not
definite. Otherwise, the lung fields are clear. There is no pleural
effusion or pneumothorax. The trachea is midline. The visualized
bones are normal and the upper abdomen abdominal bowel gas pattern
is nonobstructive.
IMPRESSION: Cannot exclude small early airspace opacity or focal atelectasis in
the right upper lung. Alternatively, this small asymmetric opacity
could be due to overlap of normal vascular structures. If there is
clinical concern for pneumonia, consider followup chest radiograph
and 24-48 hr.

## 2015-08-25 ENCOUNTER — Other Ambulatory Visit (HOSPITAL_COMMUNITY): Payer: Self-pay | Admitting: Nurse Practitioner

## 2015-08-25 DIAGNOSIS — T671XXD Heat syncope, subsequent encounter: Secondary | ICD-10-CM

## 2015-09-12 ENCOUNTER — Ambulatory Visit (HOSPITAL_COMMUNITY)
Admission: RE | Admit: 2015-09-12 | Discharge: 2015-09-12 | Disposition: A | Discharge status: Home / Self Care | Payer: 59 | Source: Ambulatory Visit | Attending: Nurse Practitioner | Admitting: Nurse Practitioner

## 2020-01-31 ENCOUNTER — Emergency Department (HOSPITAL_COMMUNITY)
Admission: EM | Admit: 2020-01-31 | Discharge: 2020-02-01 | Disposition: A | Discharge status: Other Acute Inpatient Hospital | Payer: 59 | Attending: Emergency Medicine | Admitting: Emergency Medicine

## 2020-01-31 DIAGNOSIS — R443 Hallucinations, unspecified: Secondary | ICD-10-CM

## 2020-01-31 DIAGNOSIS — Z20822 Contact with and (suspected) exposure to covid-19: Secondary | ICD-10-CM | POA: Insufficient documentation

## 2020-01-31 DIAGNOSIS — Z79899 Other long term (current) drug therapy: Secondary | ICD-10-CM | POA: Diagnosis not present

## 2020-01-31 DIAGNOSIS — R440 Auditory hallucinations: Secondary | ICD-10-CM | POA: Diagnosis not present

## 2020-01-31 DIAGNOSIS — F259 Schizoaffective disorder, unspecified: Secondary | ICD-10-CM | POA: Diagnosis not present

## 2020-01-31 DIAGNOSIS — R45851 Suicidal ideations: Secondary | ICD-10-CM | POA: Diagnosis not present

## 2020-01-31 DIAGNOSIS — F22 Delusional disorders: Secondary | ICD-10-CM | POA: Diagnosis present

## 2020-01-31 LAB — COMPREHENSIVE METABOLIC PANEL
ALT: 51 U/L — ABNORMAL HIGH (ref 0–44)
AST: 35 U/L (ref 15–41)
Albumin: 4.6 g/dL (ref 3.5–5.0)
Alkaline Phosphatase: 49 U/L (ref 38–126)
Anion gap: 12 (ref 5–15)
BUN: 13 mg/dL (ref 6–20)
CO2: 24 mmol/L (ref 22–32)
Calcium: 9.3 mg/dL (ref 8.9–10.3)
Chloride: 102 mmol/L (ref 98–111)
Creatinine, Ser: 0.95 mg/dL (ref 0.61–1.24)
GFR calc Af Amer: >60 mL/min (ref >60)
GFR calc non Af Amer: >60 mL/min (ref >60)
Glucose, Bld: 102 mg/dL — ABNORMAL HIGH (ref 70–99)
Potassium: 3.7 mmol/L (ref 3.5–5.1)
Sodium: 138 mmol/L (ref 135–145)
Total Bilirubin: 0.6 mg/dL (ref 0.3–1.2)
Total Protein: 7.2 g/dL (ref 6.5–8.1)

## 2020-01-31 LAB — CBC
HCT: 46.1 % (ref 39.0–52.0)
Hemoglobin: 15.5 g/dL (ref 13.0–17.0)
MCH: 29.8 pg (ref 26.0–34.0)
MCHC: 33.6 g/dL (ref 30.0–36.0)
MCV: 88.7 fL (ref 80.0–100.0)
Platelets: 271 10*3/uL (ref 150–400)
RBC: 5.2 MIL/uL (ref 4.22–5.81)
RDW: 12.1 % (ref 11.5–15.5)
WBC: 11.3 10*3/uL — ABNORMAL HIGH (ref 4.0–10.5)
nRBC: 0 % (ref 0.0–0.2)

## 2020-01-31 LAB — RAPID URINE DRUG SCREEN, HOSP PERFORMED
Amphetamines: NOT DETECTED
Barbiturates: NOT DETECTED
Benzodiazepines: NOT DETECTED
Cocaine: NOT DETECTED
Opiates: NOT DETECTED
Tetrahydrocannabinol: NOT DETECTED

## 2020-01-31 NOTE — ED Notes (Signed)
Belongings removed, placed in scrubs. Wanded by security. 

## 2020-01-31 NOTE — ED Notes (Signed)
Pt presents stating he has been hearing voices over past few days. Also reports SI. States he has hx of schizophrenia. Calm and cooperative in triage.

## 2020-02-01 ENCOUNTER — Encounter (HOSPITAL_COMMUNITY): Payer: Self-pay

## 2020-02-01 LAB — ACETAMINOPHEN LEVEL: Acetaminophen (Tylenol), Serum: <10 ug/mL — ABNORMAL LOW (ref 10–30)

## 2020-02-01 LAB — RESPIRATORY PANEL BY RT PCR (FLU A&B, COVID)
Influenza A by PCR: NEGATIVE
Influenza B by PCR: NEGATIVE
SARS Coronavirus 2 by RT PCR: NEGATIVE

## 2020-02-01 LAB — ETHANOL: Alcohol, Ethyl (B): <10 mg/dL (ref <10)

## 2020-02-01 LAB — SALICYLATE LEVEL: Salicylate Lvl: <7 mg/dL — ABNORMAL LOW (ref 7.0–30.0)

## 2020-02-01 MED ORDER — ACETAMINOPHEN 500 MG PO TABS
1000.0000 mg | ORAL_TABLET | Freq: Once | ORAL | Status: AC
  Administered 2020-02-01: 1000 mg via ORAL
  Filled 2020-02-01: qty 2

## 2020-02-01 MED ORDER — ARIPIPRAZOLE 5 MG PO TABS
15.0000 mg | ORAL_TABLET | Freq: Every day | ORAL | Status: DC
  Administered 2020-02-01: 15 mg via ORAL
  Filled 2020-02-01: qty 1

## 2020-02-01 MED ORDER — BENZTROPINE MESYLATE 1 MG PO TABS
1.0000 mg | ORAL_TABLET | Freq: Two times a day (BID) | ORAL | Status: DC
  Administered 2020-02-01: 1 mg via ORAL
  Filled 2020-02-01: qty 1

## 2020-02-01 MED ORDER — LITHIUM CARBONATE ER 450 MG PO TBCR
450.0000 mg | EXTENDED_RELEASE_TABLET | Freq: Two times a day (BID) | ORAL | Status: DC
  Administered 2020-02-01: 16:00:00 450 mg via ORAL
  Filled 2020-02-01 (×3): qty 1

## 2020-02-01 MED ORDER — BUSPIRONE HCL 10 MG PO TABS
10.0000 mg | ORAL_TABLET | Freq: Two times a day (BID) | ORAL | Status: DC
  Administered 2020-02-01: 10 mg via ORAL
  Filled 2020-02-01: qty 1

## 2020-02-01 MED ORDER — PROPRANOLOL HCL 10 MG PO TABS
10.0000 mg | ORAL_TABLET | Freq: Two times a day (BID) | ORAL | Status: DC
  Administered 2020-02-01: 10 mg via ORAL
  Filled 2020-02-01 (×2): qty 1

## 2020-02-01 NOTE — ED Provider Notes (Signed)
[facility] EMERGENCY DEPARTMENT Provider Note   CSN: [id number] Arrival date & time: 01/31/20  2234     History Chief Complaint  Patient presents with  . Medical Clearance    Brett Vincent is a 29 y.o. male.  HPI 29 year old male with a history of schizophrenia and a subarachnoid cyst presents to the ER with worsening thoughts of self-harm/paranoia which has been triggered from a recent call from a investigator.  He reports worsening of his symptoms over the past week. He states he has also been having worsening hallucinations, though he currently is not having any. He refers that has been compliant with his medications, but given recent life events his thoughts have become more intrusive and he is having difficulty managing and ignoring them.  He tried to spend several hours talking to his friend to try to control his thoughts, but this did not prove successful.  Patient is very pleasant with good insight, no signs of agitation.  He denies any homicidal ideations, states that he has suicidal voices but has no plan to harm himself.  He denies any other significant medical problems.  Denies illicit drug use or use of alcohol.  He denies chest pain, shortness of breath, abdominal pain, fevers, nausea, vomiting, diarrhea, dysuria.    Past Medical History:  Diagnosis Date  . Headache(784.0)   . Kidney stones   . Syncope and collapse 12/19/2013    Patient Active Problem List   Diagnosis Date Noted  . Headache(784.0) 12/19/2013  . Syncope and collapse 12/19/2013    No past surgical history on file.     Family History  Problem Relation Age of Onset  . Cancer Mother        follicular lymphoma  . COPD Father   . Heart disease Father     Social History   Tobacco Use  . Smoking status: Never Smoker  . Smokeless tobacco: Never Used  Substance Use Topics  . Alcohol use: No  . Drug use: No    Home Medications Prior to Admission medications   Medication Sig  Start Date End Date Taking? Authorizing Provider  ARIPiprazole (ABILIFY) 15 MG tablet Take 15 mg by mouth daily.   Yes [provider]  benztropine (COGENTIN) 1 MG tablet Take 1 mg by mouth 2 (two) times daily.   Yes [provider]  busPIRone (BUSPAR) 10 MG tablet Take 10 mg by mouth 2 (two) times daily.   Yes [provider]  lithium carbonate (ESKALITH) 450 MG CR tablet Take 450 mg by mouth 2 (two) times daily.   Yes [provider]  propranolol (INDERAL) 10 MG tablet Take 10 mg by mouth 2 (two) times daily.   Yes [provider]    Allergies    Prednisone  Review of Systems   Review of Systems  Constitutional: Negative for chills and fever.  HENT: Negative for ear pain and sore throat.   Eyes: Negative for pain and visual disturbance.  Respiratory: Negative for cough and shortness of breath.   Cardiovascular: Negative for chest pain and palpitations.  Gastrointestinal: Negative for abdominal pain and vomiting.  Genitourinary: Negative for dysuria and hematuria.  Musculoskeletal: Negative for arthralgias and back pain.  Skin: Negative for color change and rash.  Neurological: Negative for seizures and syncope.  Psychiatric/Behavioral: Positive for behavioral problems, confusion, decreased concentration, hallucinations, sleep disturbance and suicidal ideas. Negative for agitation, dysphoric mood and self-injury. The patient is nervous/anxious and is hyperactive.   All  other systems reviewed and are negative.   Physical Exam Updated Vital Signs BP 130/85 (BP Location: Right Arm)   Pulse 87   Temp 97.9 F (36.6 C) (Oral)   Resp (!) 22   SpO2 100%   Physical Exam Vitals and nursing note reviewed.  Constitutional:      General: He is not in acute distress.    Appearance: Normal appearance. He is well-developed. He is not ill-appearing.  HENT:     Head: Normocephalic and atraumatic.     Mouth/Throat:     Mouth: Mucous membranes are  moist.     Pharynx: Oropharynx is clear.  Eyes:     Extraocular Movements: Extraocular movements intact.     Conjunctiva/sclera: Conjunctivae normal.     Pupils: Pupils are equal, round, and reactive to light.  Cardiovascular:     Rate and Rhythm: Normal rate and regular rhythm.     Heart sounds: No murmur.  Pulmonary:     Effort: Pulmonary effort is normal. No respiratory distress.     Breath sounds: Normal breath sounds.  Abdominal:     General: Abdomen is flat. Bowel sounds are normal.     Palpations: Abdomen is soft.     Tenderness: There is no abdominal tenderness.  Musculoskeletal:        General: Normal range of motion.     Cervical back: Neck supple.  Skin:    General: Skin is warm and dry.  Neurological:     General: No focal deficit present.     Mental Status: He is alert and oriented to person, place, and time.  Psychiatric:        Mood and Affect: Mood normal.        Behavior: Behavior normal.     ED Results / Procedures / Treatments   Labs (all labs ordered are listed, but only abnormal results are displayed) Labs Reviewed  COMPREHENSIVE METABOLIC PANEL - Abnormal; Notable for the following components:      Result Value   Glucose, Bld 102 (*)    ALT 51 (*)    All other components within normal limits  SALICYLATE LEVEL - Abnormal; Notable for the following components:   Salicylate Lvl <7.0 (*)    All other components within normal limits  ACETAMINOPHEN LEVEL - Abnormal; Notable for the following components:   Acetaminophen (Tylenol), Serum <10 (*)    All other components within normal limits  CBC - Abnormal; Notable for the following components:   WBC 11.3 (*)    All other components within normal limits  RESPIRATORY PANEL BY RT PCR (FLU A&B, COVID)  ETHANOL  RAPID URINE DRUG SCREEN, HOSP PERFORMED    EKG None  Radiology No results found.  Procedures Procedures (including critical care time)  Medications Ordered in ED Medications - No data to  display  ED Course  I have reviewed the triage vital signs and the nursing notes.  Pertinent labs & imaging results that were available during my care of the patient were reviewed by me and considered in my medical decision making (see chart for details).    MDM Rules/Calculators/A&P                     29 year old male with schizophrenia/worsening intrusive thoughts. Presentation patient is nontoxic appearing, no acute distress.  He is pleasant with good insight.  Vitals nonconcerning.  Patient has no physical complaints at this time.  Labs reassuring.  No significant medical history.  From  the medical perspective, he is cleared to be placed in psychiatric observation due to the need to provide a safe environment for the patient while obtaining psychiatric consultation and evaluation  Final Clinical Impression(s) / ED Diagnoses Final diagnoses:  Suicidal thoughts  Hallucination    Rx / DC Orders ED Discharge Orders    None       Lyndel Safe 68/34/19 6222    Delora Fuel, MD 97/98/92 2248

## 2020-02-01 NOTE — Progress Notes (Signed)
Lerry Liner, NP recommends inpt tx. TTS to seek placement. ED staff Cichorski, Rhona Raider, RN and EDP Mare Ferrari, PA-C have been advised.

## 2020-02-01 NOTE — ED Notes (Signed)
This RN called [facility] for transportation to facility. Rubin Payor, MD made aware of transfer and will be overseeing until completed.

## 2020-02-01 NOTE — ED Notes (Signed)
Report given to [facility]. Dr. Rubin Payor completed EMTALA documentation. Valuables/Belongings given to [place]. Dawn made aware by thei RN that belongings were left with transporter and pt will be sent with no personal belongings on his physical person other than hospital attire.

## 2020-02-01 NOTE — ED Notes (Signed)
Lunch tray ordered 

## 2020-02-01 NOTE — Progress Notes (Signed)
Pt accepted to [facility]. Room to be determined.      Dr. Ulyses Amor is the accepting and attending physician.   Call report to [phone].   April @ [facility] ED notified.     Pt is Voluntary.    Pt may be transported by [employer], [employer].   Pt scheduled to arrive as soon as possible. Bed is available now.   Drucilla Schmidt, MSW, LCSW-A Clinical Disposition Social Worker [employer] [facility]/TTS [phone]

## 2020-02-01 NOTE — ED Notes (Signed)
TTS being conducted.  

## 2020-02-01 NOTE — ED Provider Notes (Signed)
  Physical Exam  BP 102/63   Pulse 81   Temp 97.9 F (36.6 C) (Oral)   Resp (!) 22   Ht 6\' 10"  (2.083 m)   Wt 108.9 kg   SpO2 96%   BMI 25.09 kg/m   Physical Exam  ED Course/Procedures     Procedures  MDM  Patient excepted [facility].  Pending transport.  Reevaluated.       , MD 02/01/20 1736

## 2020-02-01 NOTE — ED Notes (Signed)
Pt at nurses desk to notify family of recent acceptance to [facility]. This RN is attempting to call report to same facility.

## 2020-02-01 NOTE — BH Assessment (Signed)
Tele Assessment Note   Patient Name: Brett Vincent MRN: [id number] Referring Physician: Lyndel Safe Location of Patient: [facility] Location of Provider: [place] Department  Brett Vincent is an 29 y.o. male who presents to the ED voluntarily. Pt reports he has been experiencing increased SI due to command AH that tell him to kill himself. Pt states the AH "say scary stuff." Pt reports he has experienced AVH for most of his life, however 2 days ago things got worse. Pt states it was triggered by finding out that he is wanted by the "SBI". TTS asked what that is and if he knew why they were looking for him. Pt states he does not know what the organization is or why they are looking for him. Upon research, "SBI" is associated with a "[place] Pt states he is unfamiliar and does not know the reason that he is under investigation but appears anxious and concerned. Pt states he feels paranoid and anxious. Pt reports multiple inpt admissions c/o similar concerns. Pt states he has an ACTT team that he feels has been helpful. Pt states he lives with a friend but reports he does not consent for TTS to contact collateral supports at this hour.  Pt is alert and oriented. Pt responds appropriately during the assessment. Pt makes appropriate eye contact. Pt's thought content is congruent with mood. Pt denies substance use. Pt appears anxious and depressed during the assessment.  Anette Riedel, NP recommends inpt tx. TTS to seek placement. ED staff Cichorski, Tanna Savoy, RN and EDP Garald Balding, PA-C have been advised.  Diagnosis: Schizoaffective d/o  Past Medical History:  Past Medical History:  Diagnosis Date  . Headache(784.0)   . Kidney stones   . Syncope and collapse 12/19/2013    No past surgical history on file.  Family History:  Family History  Problem Relation Age of Onset  . Cancer Mother        follicular lymphoma  . COPD Father   . Heart disease Father      Social History:  reports that he has never smoked. He has never used smokeless tobacco. He reports that he does not drink alcohol or use drugs.  Additional Social History:  Alcohol / Drug Use Pain Medications: See MAR Prescriptions: See MAR Over the Counter: See MAR History of alcohol / drug use?: No history of alcohol / drug abuse  CIWA: CIWA-Ar BP: 102/63 Pulse Rate: 81 COWS:    Allergies:  Allergies  Allergen Reactions  . Prednisone Other (See Comments)    agitation    Home Medications: (Not in a hospital admission)   OB/GYN Status:  No LMP for male patient.  General Assessment Data Location of Assessment: [facility] ED TTS Assessment: In system Is this a Tele or Face-to-Face Assessment?: Tele Assessment Is this an Initial Assessment or a Re-assessment for this encounter?: Initial Assessment Patient Accompanied by:: N/A Language Other than English: No Living Arrangements: Other (Comment) What gender do you identify as?: Male Marital status: Single Pregnancy Status: No Living Arrangements: Non-relatives/Friends Can pt return to current living arrangement?: Yes Admission Status: Voluntary Is patient capable of signing voluntary admission?: Yes Referral Source: Self/Family/Friend Insurance type: [place] Living Arrangements: Non-relatives/Friends Name of Psychiatrist: [facility] Name of Therapist: [facility]  Education Status Is patient currently in school?: No Is the patient employed, unemployed or 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 : No Suicidal Intent: No Has patient had any suicidal intent within the past 6 months prior to admission? : No Is patient at risk for suicide?: Yes Suicidal Plan?: No Has patient had any suicidal plan  within the past 6 months prior to admission? : No Access to Means: No What has been your use of drugs/alcohol within the last 12 months?: denies Previous Attempts/Gestures: No Triggers for Past Attempts: None known Intentional Self Injurious Behavior: None Family Suicide History: No Recent stressful life event(s): Other (Comment)(AVH) Persecutory voices/beliefs?: Yes Depression: Yes Depression Symptoms: Despondent, Feeling worthless/self pity Substance abuse history and/or treatment for substance abuse?: No Suicide prevention information given to non-admitted patients: Not applicable  Risk to Others within the past 6 months Homicidal Ideation: No Does patient have any lifetime risk of violence toward others beyond the six months prior to admission? : No Thoughts of Harm to Others: No Current Homicidal Intent: No Current Homicidal Plan: No Access to Homicidal Means: No History of harm to others?: No Assessment of Violence: None Noted Does patient have access to weapons?: No(pt owns guns says they are locked away in safe) Criminal Charges Pending?: No Does patient have a court date: No Is patient on probation?: No  Psychosis Hallucinations: Auditory, Visual, With command Delusions: Persecutory  Mental Status Report Appearance/Hygiene: Disheveled Eye Contact: Good Motor Activity: Restlessness Speech: Logical/coherent Level of Consciousness: Alert Mood: Anxious, Despair, Helpless, Depressed Affect: Anxious, Frightened, Depressed Anxiety Level: Severe Thought Processes: Relevant, Coherent Judgement: Impaired Orientation: Person, Place, Time, Situation, Appropriate for developmental age Obsessive Compulsive Thoughts/Behaviors: None  Cognitive Functioning Concentration: Normal Memory: Remote Intact, Recent Intact Is patient IDD: No Insight: Poor Impulse Control: Poor Appetite: Good Have you had any weight changes? : No Change Sleep: No Change Total Hours of Sleep:  8 Vegetative Symptoms: None  ADLScreening [facility] Assessment Services) Patient's cognitive ability adequate to safely complete daily activities?: Yes Patient able to express need for assistance with ADLs?: Yes Independently performs ADLs?: Yes (appropriate for developmental age)  Prior Inpatient Therapy Prior Inpatient Therapy: Yes Prior Therapy Dates: 2020 Prior Therapy Facilty/Provider(s): [facility] Reason for Treatment: Hallucinations  Prior Outpatient Therapy Prior Outpatient Therapy: Yes Prior Therapy Dates: ongoing Prior Therapy Facilty/Provider(s): [facility] Reason for Treatment: Schizophrenia Does patient have an ACCT team?: Yes Does patient have Intensive In-House Services?  : No Does patient have [facility] services? : No Does patient have P4CC services?: No  ADL Screening (condition at time of admission) Patient's cognitive ability adequate to safely complete daily activities?: Yes Is the patient deaf or have difficulty hearing?: No Does the patient have difficulty seeing, even when wearing glasses/contacts?: No Does the patient have difficulty concentrating, remembering, or making decisions?: No Patient able to express need for assistance with ADLs?: Yes Does the patient have difficulty dressing or bathing?: No Independently performs ADLs?: Yes (appropriate for developmental age) Does the patient have difficulty walking or climbing stairs?: No Weakness of Legs: None Weakness of Arms/Hands: None  Home Assistive Devices/Equipment Home Assistive Devices/Equipment: Eyeglasses    Abuse/Neglect Assessment (Assessment to be complete while patient is alone) Abuse/Neglect Assessment Can Be Completed: Yes Physical Abuse: Denies Verbal Abuse: Denies Sexual Abuse: Denies Exploitation of patient/patient's resources: Denies Self-Neglect: Denies     Merchant navy officer (For Healthcare) Does Patient Have a Medical Advance Directive?: No Would patient like  information on creating a medical advance directive?:  No - Patient declined          Disposition: Lerry Liner, NP recommends inpt tx. TTS to seek placement. Disposition Initial Assessment Completed for this Encounter: Yes Disposition of Patient: Admit Type of inpatient treatment program: Adult Patient refused recommended treatment: No  This service was provided via telemedicine using a 2-way, interactive audio and video technology.  Names of all persons participating in this telemedicine service and their role in this encounter. Name:  July Nickson Role: Patient  Name: Lerry Liner, NP Role: [facility] Provider  Name: Princess Bruins Role: TTS       Karolee Ohs 02/01/2020 6:27 AM

## 2020-02-01 NOTE — ED Notes (Signed)
RN at facility unable to take report at this time; name and number were left with RN and this RN will be notified when report can be given.  

## 2020-02-01 NOTE — Progress Notes (Signed)
Pt meets inpatient criteria. Referral information has been sent to the following hospitals for review:  [facility] Details [facility] Details [facility] Details [facility] Details  [facility] Details [facility] Details  [facility] Details [facility] Details [facility]  Disposition will continue to assist with inpatient placement needs.   Wells Guiles, LCSW, LCAS Disposition CSW [facility]/TTS [phone]

## 2023-04-29 ENCOUNTER — Emergency Department (HOSPITAL_BASED_OUTPATIENT_CLINIC_OR_DEPARTMENT_OTHER)
Admission: EM | Admit: 2023-04-29 | Discharge: 2023-04-29 | Disposition: A | Discharge status: Home / Self Care | Payer: Medicaid Other | Attending: Emergency Medicine | Admitting: Emergency Medicine

## 2023-04-29 ENCOUNTER — Encounter (HOSPITAL_BASED_OUTPATIENT_CLINIC_OR_DEPARTMENT_OTHER): Payer: Self-pay

## 2023-04-29 ENCOUNTER — Emergency Department (HOSPITAL_BASED_OUTPATIENT_CLINIC_OR_DEPARTMENT_OTHER): Payer: Medicaid Other | Admitting: Radiology

## 2023-04-29 ENCOUNTER — Emergency Department (HOSPITAL_BASED_OUTPATIENT_CLINIC_OR_DEPARTMENT_OTHER): Payer: Medicaid Other

## 2023-04-29 ENCOUNTER — Other Ambulatory Visit: Payer: Self-pay

## 2023-04-29 DIAGNOSIS — Z8572 Personal history of non-Hodgkin lymphomas: Secondary | ICD-10-CM | POA: Diagnosis not present

## 2023-04-29 DIAGNOSIS — R079 Chest pain, unspecified: Secondary | ICD-10-CM | POA: Diagnosis present

## 2023-04-29 LAB — CBC
HCT: 43.8 % (ref 39.0–52.0)
Hemoglobin: 15 g/dL (ref 13.0–17.0)
MCH: 29.1 pg (ref 26.0–34.0)
MCHC: 34.2 g/dL (ref 30.0–36.0)
MCV: 84.9 fL (ref 80.0–100.0)
Platelets: 274 10*3/uL (ref 150–400)
RBC: 5.16 MIL/uL (ref 4.22–5.81)
RDW: 12.4 % (ref 11.5–15.5)
WBC: 11.1 10*3/uL — ABNORMAL HIGH (ref 4.0–10.5)
nRBC: 0 % (ref 0.0–0.2)

## 2023-04-29 LAB — BASIC METABOLIC PANEL
Anion gap: 11 (ref 5–15)
BUN: 8 mg/dL (ref 6–20)
CO2: 27 mmol/L (ref 22–32)
Calcium: 9.7 mg/dL (ref 8.9–10.3)
Chloride: 102 mmol/L (ref 98–111)
Creatinine, Ser: 0.93 mg/dL (ref 0.61–1.24)
GFR, Estimated: >60 mL/min (ref >60)
Glucose, Bld: 118 mg/dL — ABNORMAL HIGH (ref 70–99)
Potassium: 3.8 mmol/L (ref 3.5–5.1)
Sodium: 140 mmol/L (ref 135–145)

## 2023-04-29 LAB — TROPONIN I (HIGH SENSITIVITY)
Troponin I (High Sensitivity): 2 ng/L (ref <18)
Troponin I (High Sensitivity): <2 ng/L (ref <18)

## 2023-04-29 MED ORDER — SUCRALFATE 1 G PO TABS: 1.0000 g | ORAL_TABLET | Freq: Four times a day (QID) | ORAL | 0 refills | Status: DC | PRN

## 2023-04-29 MED ORDER — HYDROMORPHONE HCL 1 MG/ML IJ SOLN
0.5000 mg | Freq: Once | INTRAMUSCULAR | Status: AC
  Administered 2023-04-29: 0.5 mg via INTRAVENOUS
  Filled 2023-04-29: qty 1

## 2023-04-29 MED ORDER — FAMOTIDINE IN NACL 20-0.9 MG/50ML-% IV SOLN
20.0000 mg | Freq: Once | INTRAVENOUS | Status: AC
  Administered 2023-04-29: 20 mg via INTRAVENOUS
  Filled 2023-04-29: qty 50

## 2023-04-29 MED ORDER — IOHEXOL 350 MG/ML SOLN
100.0000 mL | Freq: Once | INTRAVENOUS | Status: AC | PRN
  Administered 2023-04-29: 100 mL via INTRAVENOUS

## 2023-04-29 MED ORDER — LORAZEPAM 2 MG/ML IJ SOLN
2.0000 mg | Freq: Once | INTRAMUSCULAR | Status: AC
  Administered 2023-04-29: 2 mg via INTRAVENOUS
  Filled 2023-04-29: qty 1

## 2023-04-29 MED ORDER — ALUM & MAG HYDROXIDE-SIMETH 200-200-20 MG/5ML PO SUSP
30.0000 mL | Freq: Once | ORAL | Status: AC
  Administered 2023-04-29: 30 mL via ORAL
  Filled 2023-04-29: qty 30

## 2023-04-29 NOTE — ED Notes (Signed)
Patient's family member entered the room while pt was sleeping and pushed the emergency button. Pt is now C/O 10/10 chest pain. Repeat EKG obtained. EDP has been notified.

## 2023-04-29 NOTE — ED Provider Notes (Signed)
[facility] EMERGENCY [facility] Emergency Department Provider Note MRN:  [id number]  Arrival date & time: 04/29/23     Chief Complaint   Chest Pain   History of Present Illness   Brett Vincent is a 32 y.o. year-old male with a history of kidney stones presenting to the ED with chief complaint of chest pain.  Pain in the center of the chest, has happened multiple times in the past, he attributes it to esophageal spasm.  Feels like a kidney stone in the middle of his chest.  Denies any abdominal pain.  No nausea vomiting.  No shortness of breath.  Review of Systems  A thorough review of systems was obtained and all systems are negative except as noted in the HPI and PMH.   Patient's Health History    Past Medical History:  Diagnosis Date   Headache(784.0)    Kidney stones    Syncope and collapse 12/19/2013    History reviewed. No pertinent surgical history.  Family History  Problem Relation Age of Onset   Cancer Mother        follicular lymphoma   COPD Father    Heart disease Father     Social History   Socioeconomic History   Marital status: Single    Spouse name: Not on file   Number of children: 0   Years of education: college   Highest education level: Not on file  Occupational History   Not on file  Tobacco Use   Smoking status: Never   Smokeless tobacco: Never  Substance and Sexual Activity   Alcohol use: No   Drug use: No   Sexual activity: Not on file  Other Topics Concern   Not on file  Social History Narrative   Not on file   Social Determinants of Health   Financial Resource Strain: Not on file  Food Insecurity: Not on file  Transportation Needs: Not on file  Physical Activity: Not on file  Stress: Not on file  Social Connections: Not on file  Intimate Partner Violence: Not on file     Physical Exam   Vitals:   04/29/23 0330 04/29/23 0422  BP: (!) 139/93 131/80  Pulse: 90 92  Resp: 11 20  Temp:  98.3 F (36.8 C)  SpO2: 95% 96%     CONSTITUTIONAL: Well-appearing, NAD NEURO/PSYCH:  Alert and oriented x 3, no focal deficits EYES:  eyes equal and reactive ENT/NECK:  no LAD, no JVD CARDIO: Regular rate, well-perfused, normal S1 and S2 PULM:  CTAB no wheezing or rhonchi GI/GU:  non-distended, non-tender MSK/SPINE:  No gross deformities, no edema SKIN:  no rash, atraumatic   *Additional and/or pertinent findings included in MDM below  Diagnostic and Interventional Summary    EKG Interpretation Date/Time:  Friday April 29 2023 02:27:32 EDT Ventricular Rate:  102 PR Interval:  139 QRS Duration:  94 QT Interval:  340 QTC Calculation: 443 R Axis:   15  Text Interpretation: Sinus tachycardia Low voltage, precordial leads ST elevation suggests acute pericarditis Confirmed by Kennis Carina [phone]) on 04/29/2023 2:37:11 AM       Labs Reviewed  BASIC METABOLIC PANEL - Abnormal; Notable for the following components:      Result Value   Glucose, Bld 118 (*)    All other components within normal limits  CBC - Abnormal; Notable for the following components:   WBC 11.1 (*)    All other components within normal limits  TROPONIN I (HIGH SENSITIVITY)  TROPONIN  I (HIGH SENSITIVITY)    CT Angio Chest/Abd/Pel for Dissection W and/or Wo Contrast  Final Result    DG Chest Port 1 View  Final Result      Medications  LORazepam (ATIVAN) injection 2 mg (2 mg Intravenous Given 04/29/23 0256)  famotidine (PEPCID) IVPB 20 mg premix (0 mg Intravenous Stopped 04/29/23 0339)  alum & mag hydroxide-simeth (MAALOX/MYLANTA) 200-200-20 MG/5ML suspension 30 mL (30 mLs Oral Given 04/29/23 0255)  HYDROmorphone (DILAUDID) injection 0.5 mg (0.5 mg Intravenous Given 04/29/23 0413)  iohexol (OMNIPAQUE) 350 MG/ML injection 100 mL (100 mLs Intravenous Contrast Given 04/29/23 0428)     Procedures  /  Critical Care Procedures  ED Course and Medical Decision Making  Initial Impression and Ddx Favoring acute on chronic chest pain, has had  this happen multiple times in the past, attributed to esophageal spasm but this is not formally diagnosed.  Other considerations include ACS, PE felt to be less likely, dissection considered.  Past medical/surgical history that increases complexity of ED encounter: None  Interpretation of Diagnostics I personally reviewed the EKG and my interpretation is as follows: Sinus rhythm  Labs reassuring with no significant blood count or electrolyte disturbance  Patient Reassessment and Ultimate Disposition/Management     CT without evidence of dissection or PE.  Patient is more comfortable, no emergent process, appropriate for discharge.  Patient management required discussion with the following services or consulting groups:  None  Complexity of Problems Addressed Acute illness or injury that poses threat of life of bodily function  Additional Data Reviewed and Analyzed Further history obtained from: Further history from spouse/family member  Additional Factors Impacting ED Encounter Risk Prescriptions  Elmer Sow. Pilar Plate, MD [facility] Emergency Medicine [facility] [email or web address]  Final Clinical Impressions(s) / ED Diagnoses     ICD-10-CM   1. Chest pain, unspecified type  R07.9       ED Discharge Orders          Ordered    sucralfate (CARAFATE) 1 g tablet  4 times daily PRN        04/29/23 0520             Discharge Instructions Discussed with and Provided to Patient:     Discharge Instructions      You were evaluated in the Emergency Department and after careful evaluation, we did not find any emergent condition requiring admission or further testing in the hospital.  Your exam/testing today is overall reassuring.  Take your Prilosec at home daily.  Can use the Carafate as needed.  Follow-up with your regular doctors.  Please return to the Emergency Department if you experience any worsening of your condition.   Thank you for allowing [place] to  be a part of your care.       Sabas Sous, MD 04/29/23 [phone]

## 2023-04-29 NOTE — ED Triage Notes (Signed)
Patient arrives to ED POV C/O Chest Pain and SHOB. Pt states Hx of Esophageal Spasms and takes prescribes ativan for it. States last dose of ativan was at 1:30am this morning. No other complaints at this time. Pt A/O x4

## 2023-04-29 NOTE — Discharge Instructions (Signed)
You were evaluated in the Emergency Department and after careful evaluation, we did not find any emergent condition requiring admission or further testing in the hospital.  Your exam/testing today is overall reassuring.  Take your Prilosec at home daily.  Can use the Carafate as needed.  Follow-up with your regular doctors.  Please return to the Emergency Department if you experience any worsening of your condition.   Thank you for allowing [place] to be a part of your care.

## 2023-04-29 NOTE — ED Notes (Signed)
Patient resting comfortably. Denies pain, Even respirations, no distress noted at this time.

## 2023-04-30 ENCOUNTER — Emergency Department (HOSPITAL_BASED_OUTPATIENT_CLINIC_OR_DEPARTMENT_OTHER): Payer: Medicaid Other

## 2023-04-30 ENCOUNTER — Other Ambulatory Visit: Payer: Self-pay

## 2023-04-30 ENCOUNTER — Emergency Department (HOSPITAL_COMMUNITY)
Admission: EM | Admit: 2023-04-30 | Discharge: 2023-04-30 | Discharge status: Against Medical Advice | Payer: Medicaid Other | Source: Home / Self Care | Attending: Emergency Medicine | Admitting: Emergency Medicine

## 2023-04-30 ENCOUNTER — Other Ambulatory Visit: Payer: Self-pay | Admitting: General Surgery

## 2023-04-30 ENCOUNTER — Inpatient Hospital Stay (HOSPITAL_BASED_OUTPATIENT_CLINIC_OR_DEPARTMENT_OTHER)
Admission: EM | Admit: 2023-04-30 | Discharge: 2023-05-03 | DRG: 419 | Disposition: A | Discharge status: Home / Self Care | Payer: Medicaid Other | Attending: Surgery | Admitting: Surgery

## 2023-04-30 ENCOUNTER — Emergency Department (HOSPITAL_BASED_OUTPATIENT_CLINIC_OR_DEPARTMENT_OTHER): Payer: Medicaid Other | Admitting: Radiology

## 2023-04-30 ENCOUNTER — Encounter (HOSPITAL_BASED_OUTPATIENT_CLINIC_OR_DEPARTMENT_OTHER): Payer: Self-pay | Admitting: Emergency Medicine

## 2023-04-30 ENCOUNTER — Encounter (HOSPITAL_COMMUNITY): Payer: Self-pay

## 2023-04-30 DIAGNOSIS — K8000 Calculus of gallbladder with acute cholecystitis without obstruction: Secondary | ICD-10-CM | POA: Insufficient documentation

## 2023-04-30 DIAGNOSIS — K819 Cholecystitis, unspecified: Secondary | ICD-10-CM

## 2023-04-30 DIAGNOSIS — K828 Other specified diseases of gallbladder: Secondary | ICD-10-CM | POA: Diagnosis present

## 2023-04-30 DIAGNOSIS — K224 Dyskinesia of esophagus: Secondary | ICD-10-CM | POA: Insufficient documentation

## 2023-04-30 DIAGNOSIS — R079 Chest pain, unspecified: Secondary | ICD-10-CM | POA: Insufficient documentation

## 2023-04-30 DIAGNOSIS — Z5321 Procedure and treatment not carried out due to patient leaving prior to being seen by health care provider: Secondary | ICD-10-CM | POA: Insufficient documentation

## 2023-04-30 DIAGNOSIS — Z79899 Other long term (current) drug therapy: Secondary | ICD-10-CM

## 2023-04-30 DIAGNOSIS — K8012 Calculus of gallbladder with acute and chronic cholecystitis without obstruction: Principal | ICD-10-CM | POA: Diagnosis present

## 2023-04-30 DIAGNOSIS — K810 Acute cholecystitis: Principal | ICD-10-CM

## 2023-04-30 DIAGNOSIS — Z8249 Family history of ischemic heart disease and other diseases of the circulatory system: Secondary | ICD-10-CM

## 2023-04-30 DIAGNOSIS — Z825 Family history of asthma and other chronic lower respiratory diseases: Secondary | ICD-10-CM | POA: Diagnosis not present

## 2023-04-30 DIAGNOSIS — M25512 Pain in left shoulder: Secondary | ICD-10-CM | POA: Diagnosis present

## 2023-04-30 DIAGNOSIS — K660 Peritoneal adhesions (postprocedural) (postinfection): Secondary | ICD-10-CM | POA: Diagnosis not present

## 2023-04-30 DIAGNOSIS — F209 Schizophrenia, unspecified: Secondary | ICD-10-CM | POA: Diagnosis not present

## 2023-04-30 DIAGNOSIS — R0602 Shortness of breath: Secondary | ICD-10-CM | POA: Insufficient documentation

## 2023-04-30 DIAGNOSIS — Z888 Allergy status to other drugs, medicaments and biological substances status: Secondary | ICD-10-CM

## 2023-04-30 DIAGNOSIS — R7401 Elevation of levels of liver transaminase levels: Secondary | ICD-10-CM | POA: Diagnosis not present

## 2023-04-30 DIAGNOSIS — M25511 Pain in right shoulder: Secondary | ICD-10-CM | POA: Diagnosis present

## 2023-04-30 LAB — CBC WITH DIFFERENTIAL/PLATELET
Abs Immature Granulocytes: 0.07 10*3/uL (ref 0.00–0.07)
Basophils Absolute: 0 10*3/uL (ref 0.0–0.1)
Basophils Relative: 1 %
Eosinophils Absolute: 0.1 10*3/uL (ref 0.0–0.5)
Eosinophils Relative: 2 %
HCT: 43.1 % (ref 39.0–52.0)
Hemoglobin: 14.6 g/dL (ref 13.0–17.0)
Immature Granulocytes: 1 %
Lymphocytes Relative: 30 %
Lymphs Abs: 2.1 10*3/uL (ref 0.7–4.0)
MCH: 29.1 pg (ref 26.0–34.0)
MCHC: 33.9 g/dL (ref 30.0–36.0)
MCV: 86 fL (ref 80.0–100.0)
Monocytes Absolute: 0.5 10*3/uL (ref 0.1–1.0)
Monocytes Relative: 7 %
Neutro Abs: 4.1 10*3/uL (ref 1.7–7.7)
Neutrophils Relative %: 59 %
Platelets: 254 10*3/uL (ref 150–400)
RBC: 5.01 MIL/uL (ref 4.22–5.81)
RDW: 12.4 % (ref 11.5–15.5)
WBC: 6.9 10*3/uL (ref 4.0–10.5)
nRBC: 0 % (ref 0.0–0.2)

## 2023-04-30 LAB — COMPREHENSIVE METABOLIC PANEL
ALT: 342 U/L — ABNORMAL HIGH (ref 0–44)
AST: 267 U/L — ABNORMAL HIGH (ref 15–41)
Albumin: 4.3 g/dL (ref 3.5–5.0)
Alkaline Phosphatase: 80 U/L (ref 38–126)
Anion gap: 10 (ref 5–15)
BUN: 10 mg/dL (ref 6–20)
CO2: 25 mmol/L (ref 22–32)
Calcium: 9.5 mg/dL (ref 8.9–10.3)
Chloride: 103 mmol/L (ref 98–111)
Creatinine, Ser: 0.9 mg/dL (ref 0.61–1.24)
GFR, Estimated: >60 mL/min (ref >60)
Glucose, Bld: 94 mg/dL (ref 70–99)
Potassium: 3.7 mmol/L (ref 3.5–5.1)
Sodium: 138 mmol/L (ref 135–145)
Total Bilirubin: 1.4 mg/dL — ABNORMAL HIGH (ref 0.3–1.2)
Total Protein: 7.3 g/dL (ref 6.5–8.1)

## 2023-04-30 LAB — LIPASE, BLOOD: Lipase: 17 U/L (ref 11–51)

## 2023-04-30 LAB — TROPONIN I (HIGH SENSITIVITY)
Troponin I (High Sensitivity): 2 ng/L (ref <18)
Troponin I (High Sensitivity): 2 ng/L (ref <18)

## 2023-04-30 MED ORDER — MORPHINE SULFATE (PF) 4 MG/ML IV SOLN: 1.0000 mg | INTRAVENOUS | Status: AC | PRN

## 2023-04-30 MED ORDER — PROCHLORPERAZINE MALEATE 5 MG PO TABS
10.0000 mg | ORAL_TABLET | Freq: Four times a day (QID) | ORAL | Status: AC | PRN
Start: 2023-04-30 — End: ?

## 2023-04-30 MED ORDER — PROCHLORPERAZINE EDISYLATE 10 MG/2ML IJ SOLN
5.0000 mg | Freq: Four times a day (QID) | INTRAMUSCULAR | Status: AC | PRN
Start: 2023-04-30 — End: ?

## 2023-04-30 MED ORDER — DIPHENHYDRAMINE HCL 50 MG/ML IJ SOLN: 12.5000 mg | Freq: Four times a day (QID) | INTRAMUSCULAR | Status: AC | PRN

## 2023-04-30 MED ORDER — ALUM & MAG HYDROXIDE-SIMETH 200-200-20 MG/5ML PO SUSP: 30.0000 mL | Freq: Once | ORAL | Status: DC

## 2023-04-30 MED ORDER — ONDANSETRON 4 MG PO TBDP: 4.0000 mg | ORAL_TABLET | Freq: Four times a day (QID) | ORAL | Status: AC | PRN

## 2023-04-30 MED ORDER — OXYCODONE HCL 5 MG PO TABS
5.0000 mg | ORAL_TABLET | ORAL | Status: AC | PRN
Start: 2023-04-30 — End: ?

## 2023-04-30 MED ORDER — ENOXAPARIN SODIUM 150 MG/ML IJ SOSY
40.0000 mg | PREFILLED_SYRINGE | INTRAMUSCULAR | Status: AC
Start: 2023-05-01 — End: 2026-01-24

## 2023-04-30 MED ORDER — DIPHENHYDRAMINE HCL 12.5 MG/5ML PO ELIX
12.5000 mg | ORAL_SOLUTION | Freq: Four times a day (QID) | ORAL | Status: AC | PRN
Start: 2023-04-30 — End: ?

## 2023-04-30 MED ORDER — SODIUM CHLORIDE 0.9 % IV SOLN
2.0000 g | Freq: Once | INTRAVENOUS | Status: AC
  Administered 2023-04-30: 2 g via INTRAVENOUS
  Filled 2023-04-30: qty 20

## 2023-04-30 MED ORDER — METHOCARBAMOL 500 MG PO TABS: 500.0000 mg | ORAL_TABLET | Freq: Three times a day (TID) | ORAL | Status: AC | PRN

## 2023-04-30 MED ORDER — ACETAMINOPHEN 325 MG PO TABS: 1000.0000 mg | ORAL_TABLET | Freq: Four times a day (QID) | ORAL | Status: AC

## 2023-04-30 MED ORDER — DEXTROSE 5 % IV SOLN
500.0000 mg | Freq: Three times a day (TID) | INTRAVENOUS | Status: AC | PRN
Start: 2023-04-30 — End: ?

## 2023-04-30 MED ORDER — DEXTROSE 5 % IV SOLN
2.0000 g | INTRAVENOUS | Status: AC
Start: 2023-04-30 — End: 2023-05-07

## 2023-04-30 MED ORDER — KCL IN DEXTROSE-NACL 20-5-0.45 MEQ/L-%-% IV SOLN: INTRAVENOUS | Status: AC

## 2023-04-30 MED ORDER — MORPHINE SULFATE (PF) 4 MG/ML IV SOLN
4.0000 mg | Freq: Once | INTRAVENOUS | Status: AC
  Administered 2023-04-30: 4 mg via INTRAVENOUS
  Filled 2023-04-30: qty 1

## 2023-04-30 MED ORDER — SODIUM CHLORIDE 0.9 % IV SOLN
4.0000 mg | Freq: Four times a day (QID) | INTRAVENOUS | Status: AC | PRN
Start: 2023-04-30 — End: ?

## 2023-04-30 MED ORDER — LORAZEPAM 2 MG/ML IJ SOLN
2.0000 mg | Freq: Once | INTRAMUSCULAR | Status: AC
  Administered 2023-04-30: 2 mg via INTRAVENOUS
  Filled 2023-04-30: qty 1

## 2023-04-30 NOTE — ED Notes (Signed)
Report to floor RN

## 2023-04-30 NOTE — ED Provider Notes (Signed)
[place] EMERGENCY DEPARTMENT AT [facility] Provider Note   CSN: [id number] Arrival date & time: 04/30/23  1533     History  Chief Complaint  Patient presents with   Chest Pain    Brett Vincent is a 32 y.o. male with a past medical history significant for kidney stones and history of headaches who presents to the ED due to chest pain that has been persistent since 11 AM this morning.  Patient notes he has a history of esophageal spasms which typically present as chest pain.  He was seen in the ED yesterday for the same with reassuring workup.  CTA negative for PE or dissection.  Patient follow GI at [facility].  Patient was evaluated by GI last year where they recommended an endoscopy to further evaluate his symptoms.  Patient canceled endoscopy at that time because he was feeling better.  Per GI note patient was advised to continue taking Ativan as needed for his esophageal spasms.  Patient prescribed Ativan earlier today and has taken 2 mg prior to arrival.  He notes pain has somewhat improved after Ativan.  Patient returns to the ED today requesting a GI referral to a specialist here in town.  Denies associated shortness of breath, nausea, vomiting, or diaphoresis.  No lower extremity edema.  Denies history of blood clots, recent surgeries or recent long immobilizations, or hormonal treatments.  History obtained from patient and past medical records. No interpreter used during encounter.      Home Medications Prior to Admission medications   Medication Sig Start Date End Date Taking? Authorizing Provider  ARIPiprazole (ABILIFY) 15 MG tablet Take 15 mg by mouth daily.    [provider]  benztropine (COGENTIN) 1 MG tablet Take 1 mg by mouth 2 (two) times daily.    [provider]  busPIRone (BUSPAR) 10 MG tablet Take 10 mg by mouth 2 (two) times daily.    [provider]  lithium carbonate (ESKALITH) 450 MG CR tablet Take 450 mg by mouth 2 (two) times  daily.    [provider]  propranolol (INDERAL) 10 MG tablet Take 10 mg by mouth 2 (two) times daily.    [provider]  sucralfate (CARAFATE) 1 g tablet Take 1 tablet (1 g total) by mouth 4 (four) times daily as needed. 04/29/23   Sabas Sous, MD      Allergies    Prednisone    Review of Systems   Review of Systems  Constitutional:  Negative for chills and fever.  Respiratory:  Negative for shortness of breath.   Cardiovascular:  Positive for chest pain. Negative for leg swelling.  Gastrointestinal:  Negative for abdominal pain, nausea and vomiting.    Physical Exam Updated Vital Signs BP (!) 140/93 (BP Location: Right Arm)   Pulse 97   Temp 98.5 F (36.9 C) (Oral)   Resp 17   Ht 5\' 10"  (1.778 m)   Wt 124.7 kg   SpO2 97%   BMI 39.46 kg/m  Physical Exam Vitals and nursing note reviewed.  Constitutional:      General: He is not in acute distress.    Appearance: He is not ill-appearing.  HENT:     Head: Normocephalic.  Eyes:     Pupils: Pupils are equal, round, and reactive to light.  Cardiovascular:     Rate and Rhythm: Normal rate and regular rhythm.     Pulses: Normal pulses.     Heart sounds: Normal heart sounds. No  murmur heard.    No friction rub. No gallop.  Pulmonary:     Effort: Pulmonary effort is normal.     Breath sounds: Normal breath sounds.  Abdominal:     General: Abdomen is flat. There is no distension.     Palpations: Abdomen is soft.     Tenderness: There is no abdominal tenderness. There is no guarding or rebound.  Musculoskeletal:        General: Normal range of motion.     Cervical back: Neck supple.  Skin:    General: Skin is warm and dry.  Neurological:     General: No focal deficit present.     Mental Status: He is alert.  Psychiatric:        Mood and Affect: Mood normal.        Behavior: Behavior normal.        ED Results / Procedures / Treatments   Labs (all labs ordered are listed, but only abnormal  results are displayed) Labs Reviewed  COMPREHENSIVE METABOLIC PANEL - Abnormal; Notable for the following components:      Result Value   AST 267 (*)    ALT 342 (*)    Total Bilirubin 1.4 (*)    All other components within normal limits  CBC WITH DIFFERENTIAL/PLATELET  LIPASE, BLOOD  HEPATITIS PANEL, ACUTE  TROPONIN I (HIGH SENSITIVITY)  TROPONIN I (HIGH SENSITIVITY)    EKG EKG Interpretation Date/Time:  Saturday April 30 2023 15:47:05 EDT Ventricular Rate:  100 PR Interval:  137 QRS Duration:  94 QT Interval:  342 QTC Calculation: 442 R Axis:   1  Text Interpretation: Sinus tachycardia No significant change since last tracing Confirmed by Alvira Monday ([id number]) on 04/30/2023 5:36:32 PM  Radiology US Abdomen Limited RUQ (LIVER/GB)  Result Date: 04/30/2023 CLINICAL DATA:  Elevated LFTs.  Esophageal spasms getting worse. EXAM: ULTRASOUND ABDOMEN LIMITED RIGHT UPPER QUADRANT COMPARISON:  CT angio chest abdomen pelvis 04/29/2023, CT abdomen pelvis 10/22/2011 FINDINGS: Gallbladder: Hypoechoic material in the gallbladder neck with a few tiny echogenic foci, favored to reflect sludge with a few tiny gallstones. No wall thickening visualized. No pericholecystic fluid. Sonographer reports positive sonographic Murphy's sign. Common bile duct: Diameter: 0.5 cm Liver: No focal lesion identified. Diffuse increased echogenicity. Within normal limits in parenchymal echogenicity. Portal vein is patent on color Doppler imaging with normal direction of blood flow towards the liver. Other: None. IMPRESSION: 1. Hypoechoic material in the gallbladder neck with a few tiny echogenic foci, favored to reflect sludge with a few tiny gallstones. Sonographer reports positive sonographic Murphy's sign. Findings are favored to indicate early acute cholecystitis. 2. Hepatic steatosis. Electronically Signed   By: Sherron Ales M.D.   On: 04/30/2023 19:50   DG Chest 2 View  Result Date: 04/30/2023 CLINICAL DATA:   Chest pain EXAM: CHEST - 2 VIEW COMPARISON:  Chest x-ray April 29, 2023 FINDINGS: The cardiomediastinal silhouette is unchanged in contour. Low lung volumes with bronchovascular crowding. No focal pulmonary opacity. No pleural effusion or pneumothorax. The visualized upper abdomen is unremarkable. No acute osseous abnormality. IMPRESSION: No active cardiopulmonary disease. Electronically Signed   By: Jacob Moores M.D.   On: 04/30/2023 16:52   CT Angio Chest/Abd/Pel for Dissection W and/or Wo Contrast  Result Date: 04/29/2023 CLINICAL DATA:  32 year old male with chest pain and shortness of breath. EXAM: CT ANGIOGRAPHY CHEST, ABDOMEN AND PELVIS TECHNIQUE: Non-contrast CT of the chest was initially obtained. Multidetector CT imaging through the chest, abdomen and pelvis  was performed using the standard protocol during bolus administration of intravenous contrast. Multiplanar reconstructed images and MIPs were obtained and reviewed to evaluate the vascular anatomy. RADIATION DOSE REDUCTION: This exam was performed according to the departmental dose-optimization program which includes automated exposure control, adjustment of the mA and/or kV according to patient size and/or use of iterative reconstruction technique. CONTRAST:  OMNIPAQUE IOHEXOL 350 MG/ML SOLN COMPARISON:  Portable chest x-ray 0254 hours today. CT Abdomen and Pelvis 10/22/2011. FINDINGS: CTA CHEST FINDINGS Cardiovascular: Mild cardiac pulsation artifact. Normal thoracic aorta. Normal heart size. No pericardial effusion. Other major mediastinal vascular structures are enhancing as expected and appear unremarkable. Mediastinum/Nodes: Negative. No mediastinal mass or lymphadenopathy. Unremarkable thoracic esophagus which is mostly decompressed throughout the chest. Lungs/Pleura: Symmetric atelectasis and mild respiratory motion. Major airways remain patent. No pleural effusion, consolidation. Musculoskeletal: Negative. Review of the MIP images  confirms the above findings. CTA ABDOMEN AND PELVIS FINDINGS VASCULAR Normal abdominal aorta and aortoiliac bifurcation. Major aortic branches appear patent and normal (duplicated right main renal artery, normal variant). Iliac and proximal femoral arteries are patent. Review of the MIP images confirms the above findings. NON-VASCULAR Hepatobiliary: Negative early postcontrast appearance of the liver and gallbladder. Pancreas: Negative. Spleen: Negative.  Splenule, normal variant (series 4, image 139). Adrenals/Urinary Tract: Normal adrenal glands. Kidneys appear nonobstructed and demonstrate symmetric early enhancement. Decompressed ureters. No nephrolithiasis. Unremarkable bladder. Bilateral pelvic phleboliths. Stomach/Bowel: Negative large bowel. Normal appendix on series 4, image 218. No dilated small bowel. Small to moderate volume gastric contents. Duodenum is decompressed. No gastro duodenal inflammation identified. No free air, free fluid, or mesenteric inflammation elsewhere. Lymphatic: No lymphadenopathy. Reproductive: Negative. Other: No pelvis free fluid. Musculoskeletal: Negative. Review of the MIP images confirms the above findings. IMPRESSION: 1. Normal CTA chest, abdomen, and pelvis. 2. Pulmonary atelectasis. Electronically Signed   By: Odessa Fleming M.D.   On: 04/29/2023 05:09   DG Chest Port 1 View  Result Date: 04/29/2023 CLINICAL DATA:  Chest pain and shortness of breath. EXAM: PORTABLE CHEST 1 VIEW COMPARISON:  December 12, 2013 FINDINGS: The heart size and mediastinal contours are within normal limits. Both lungs are clear. The visualized skeletal structures are unremarkable. IMPRESSION: No active disease. Electronically Signed   By: Aram Candela M.D.   On: 04/29/2023 03:06    Procedures Procedures    Medications Ordered in ED Medications  alum & mag hydroxide-simeth (MAALOX/MYLANTA) 200-200-20 MG/5ML suspension 30 mL (30 mLs Oral Patient Refused/Not Given 04/30/23 1641)  cefTRIAXone  (ROCEPHIN) 2 g in sodium chloride 0.9 % 100 mL IVPB (2 g Intravenous New Bag/Given 04/30/23 2117)  LORazepam (ATIVAN) injection 2 mg (2 mg Intravenous Given 04/30/23 1642)    ED Course/ Medical Decision Making/ A&P Clinical Course as of 04/30/23 2142  Sat Apr 30, 2023  1808 AST(!): 267 [CA]  1809 ALT(!): 342 [CA]    Clinical Course User Index [CA] Mannie Stabile, PA-C                             Medical Decision Making Amount and/or Complexity of Data Reviewed Independent Historian: parent External Data Reviewed: notes. Labs: ordered. Decision-making details documented in ED Course. Radiology: ordered and independent interpretation performed. Decision-making details documented in ED Course. ECG/medicine tests: ordered and independent interpretation performed. Decision-making details documented in ED Course.  Risk OTC drugs. Prescription drug management. Decision regarding hospitalization.   This patient presents to the ED for concern of  CP, this involves an extensive number of treatment options, and is a complaint that carries with it a high risk of complications and morbidity.  The differential diagnosis includes ACS, PE, dissection, esophageal spasms, pancreatitis, acute cholecystitis, etc  32 year old male presents to the ED due to chest pain which he attributes to his history of esophageal spasms.  Patient is followed by GI at [facility].  Takes Ativan as needed for esophageal spasms.  Patient seen in the ED yesterday with reassuring workup.  Negative CTA chest.  Returns today requesting a GI referral to a specialist here in town.  Chest pain has been persistent since 11 AM this morning with improvement after Ativan.  Upon arrival patient afebrile, not tachycardic or hypoxic.  Patient in no acute distress.  Patient resting comfortably in bed.  No lower extremity edema.  Cardiac labs ordered. CMP and Lipase to rule out GI etiology of pain. EKG and CXR. Ativan given. GI cocktail.  Lower  suspicion for PE or dissection given reassuring CTA chest yesterday.  CMP significant for transaminitis.  Elevated AST at 267 and ALT at 342.  Upon repeat abdominal exam patient does have some right upper quadrant tenderness.  Right upper quadrant ultrasound ordered.  Patient states in October 2023 he was admitted to the hospital for gallstone pancreatitis with elevated liver enzymes.  Hepatitis panel ordered however, patient declined.  He recently had hepatitis C lab drawn which was nonreactive.  CBC unremarkable.  No leukocytosis.  Normal hemoglobin.  Troponin normal.  Lipase normal.  Low suspicion for pancreatitis.  Chest x-ray personally reviewed and interpreted negative for signs of pneumonia, pneumothorax or widened mediastinum.  EKG demonstrates sinus tachycardia.  No signs of acute ischemia. Low suspicion for ACS.  RUQ [place] personally reviewed which demonstrates acute cholecystitis.  Discussed with Dr. Donell Beers with general surgery who will admit patient. IV antibiotics started. Suspect patient's symptoms secondary to acute cholecystitis. Low suspicion for cardiac etiology of pain.  Lives at home Has PCP Hx schizophrenia        Final Clinical Impression(s) / ED Diagnoses Final diagnoses:  Acute cholecystitis    Rx / DC Orders ED Discharge Orders     None         Jesusita Oka 04/30/23 2142    Alvira Monday, MD 05/01/23 1154

## 2023-04-30 NOTE — ED Triage Notes (Signed)
Pt c/o chest pain and SOB r/t esophageal spasms.  Pt was seen yesterday at [facility] and today at PCP for same.  Pt reports recurrent spasms and bloating.  Last dose of Ativan 2mg  around noon.

## 2023-04-30 NOTE — ED Triage Notes (Signed)
Pt arrived POV with family from [facility] ED. Pt c/o pain in the sternum and esophagus with hx of esophageal spasms and over the past 3 days he has had more frequent episodes. Pt reports 4 episodes in the past 3 days, has taken Ativan with minimal relief, last Ativan 2mg  taken today at 1200 just prior to checking in ED at [facility]. Pt denies SOB, N/V, or any additional complaints.

## 2023-04-30 NOTE — H&P (Signed)
Brett Vincent is an 32 y.o. male.   Chief Complaint: abdominal pain HPI:  Pt presented to the ED twice today with chest pain.  He has h/o esophageal spasms and ativan wasn't working for him.  He went to ED yesterday, then to [place] today, the [facility].  Yesterday had negative CTA chest.   Today labs notable for transaminitis.  Pt has known gallstones and h/o gallstone pancreatitis.  U/s today suspicious for early acute cholecystitis with stones.  He hasn't had n/v.  ***  Of note, patient has h/o schizophrenia.    Past Medical History:  Diagnosis Date   Headache(784.0)    Kidney stones    Syncope and collapse 12/19/2013    History reviewed. No pertinent surgical history.  Family History  Problem Relation Age of Onset   Cancer Mother        follicular lymphoma   COPD Father    Heart disease Father    Social History:  reports that he has never smoked. He has never used smokeless tobacco. He reports that he does not drink alcohol and does not use drugs.  Allergies:  Allergies  Allergen Reactions   Prednisone Other (See Comments)    agitation    Meds: ARIPiprazole (ABILIFY) 15 MG tablet benztropine (COGENTIN) 1 MG tablet busPIRone (BUSPAR) 10 MG tablet lithium carbonate (ESKALITH) 450 MG CR tablet propranolol (INDERAL) 10 MG tablet sucralfate (CARAFATE) 1 g tablet      Results for orders placed or performed during the hospital encounter of 04/30/23 (from the past 48 hour(s))  CBC with Differential     Status: None   Collection Time: 04/30/23  4:34 PM  Result Value Ref Range   WBC 6.9 4.0 - 10.5 K/uL   RBC 5.01 4.22 - 5.81 MIL/uL   Hemoglobin 14.6 13.0 - 17.0 g/dL   HCT 24.4 01.0 - 27.2 %   MCV 86.0 80.0 - 100.0 fL   MCH 29.1 26.0 - 34.0 pg   MCHC 33.9 30.0 - 36.0 g/dL   RDW 53.6 64.4 - 03.4 %   Platelets 254 150 - 400 K/uL   nRBC 0.0 0.0 - 0.2 %   Neutrophils Relative % 59 %   Neutro Abs 4.1 1.7 - 7.7 K/uL   Lymphocytes Relative 30 %   Lymphs Abs 2.1 0.7 -  4.0 K/uL   Monocytes Relative 7 %   Monocytes Absolute 0.5 0.1 - 1.0 K/uL   Eosinophils Relative 2 %   Eosinophils Absolute 0.1 0.0 - 0.5 K/uL   Basophils Relative 1 %   Basophils Absolute 0.0 0.0 - 0.1 K/uL   Immature Granulocytes 1 %   Abs Immature Granulocytes 0.07 0.00 - 0.07 K/uL    Comment: Performed at [facility], [address], [place], [place] [address]  Troponin I (High Sensitivity)     Status: None   Collection Time: 04/30/23  4:34 PM  Result Value Ref Range   Troponin I (High Sensitivity) 2 <18 ng/L    Comment: (NOTE) Elevated high sensitivity troponin I (hsTnI) values and significant  changes across serial measurements may suggest ACS but many other  chronic and acute conditions are known to elevate hsTnI results.  Refer to the "Links" section for chest pain algorithms and additional  guidance. Performed at [facility], [address], [place], [place] [address]   Comprehensive metabolic panel     Status: Abnormal   Collection Time: 04/30/23  4:34 PM  Result Value Ref Range  Sodium 138 135 - 145 mmol/L   Potassium 3.7 3.5 - 5.1 mmol/L   Chloride 103 98 - 111 mmol/L   CO2 25 22 - 32 mmol/L   Glucose, Bld 94 70 - 99 mg/dL    Comment: Glucose reference range applies only to samples taken after fasting for at least 8 hours.   BUN 10 6 - 20 mg/dL   Creatinine, Ser 1.61 0.61 - 1.24 mg/dL   Calcium 9.5 8.9 - 09.6 mg/dL   Total Protein 7.3 6.5 - 8.1 g/dL   Albumin 4.3 3.5 - 5.0 g/dL   AST 045 (H) 15 - 41 U/L   ALT 342 (H) 0 - 44 U/L   Alkaline Phosphatase 80 38 - 126 U/L   Total Bilirubin 1.4 (H) 0.3 - 1.2 mg/dL   GFR, Estimated >40 >98 mL/min    Comment: (NOTE) Calculated using the CKD-EPI Creatinine Equation (2021)    Anion gap 10 5 - 15    Comment: Performed at [facility], [address] [place], [place] [address]  Lipase, blood     Status: None   Collection Time: 04/30/23  4:34 PM  Result  Value Ref Range   Lipase 17 11 - 51 U/L    Comment: Performed at [facility], [address] [place], [place] [address]  Troponin I (High Sensitivity)     Status: None   Collection Time: 04/30/23  6:11 PM  Result Value Ref Range   Troponin I (High Sensitivity) 2 <18 ng/L    Comment: (NOTE) Elevated high sensitivity troponin I (hsTnI) values and significant  changes across serial measurements may suggest ACS but many other  chronic and acute conditions are known to elevate hsTnI results.  Refer to the "Links" section for chest pain algorithms and additional  guidance. Performed at [facility], [address], [place], [place] [address]    US Abdomen Limited RUQ (LIVER/GB)  Result Date: 04/30/2023 CLINICAL DATA:  Elevated LFTs.  Esophageal spasms getting worse. EXAM: ULTRASOUND ABDOMEN LIMITED RIGHT UPPER QUADRANT COMPARISON:  CT angio chest abdomen pelvis 04/29/2023, CT abdomen pelvis 10/22/2011 FINDINGS: Gallbladder: Hypoechoic material in the gallbladder neck with a few tiny echogenic foci, favored to reflect sludge with a few tiny gallstones. No wall thickening visualized. No pericholecystic fluid. Sonographer reports positive sonographic Murphy's sign. Common bile duct: Diameter: 0.5 cm Liver: No focal lesion identified. Diffuse increased echogenicity. Within normal limits in parenchymal echogenicity. Portal vein is patent on color Doppler imaging with normal direction of blood flow towards the liver. Other: None. IMPRESSION: 1. Hypoechoic material in the gallbladder neck with a few tiny echogenic foci, favored to reflect sludge with a few tiny gallstones. Sonographer reports positive sonographic Murphy's sign. Findings are favored to indicate early acute cholecystitis. 2. Hepatic steatosis. Electronically Signed   By: Sherron Ales M.D.   On: 04/30/2023 19:50   DG Chest 2 View  Result Date: 04/30/2023 CLINICAL DATA:  Chest pain EXAM: CHEST - 2 VIEW  COMPARISON:  Chest x-ray April 29, 2023 FINDINGS: The cardiomediastinal silhouette is unchanged in contour. Low lung volumes with bronchovascular crowding. No focal pulmonary opacity. No pleural effusion or pneumothorax. The visualized upper abdomen is unremarkable. No acute osseous abnormality. IMPRESSION: No active cardiopulmonary disease. Electronically Signed   By: Jacob Moores M.D.   On: 04/30/2023 16:52   CT Angio Chest/Abd/Pel for Dissection W and/or Wo Contrast  Result Date: 04/29/2023 CLINICAL DATA:  32 year old male with chest pain and shortness of breath.  EXAM: CT ANGIOGRAPHY CHEST, ABDOMEN AND PELVIS TECHNIQUE: Non-contrast CT of the chest was initially obtained. Multidetector CT imaging through the chest, abdomen and pelvis was performed using the standard protocol during bolus administration of intravenous contrast. Multiplanar reconstructed images and MIPs were obtained and reviewed to evaluate the vascular anatomy. RADIATION DOSE REDUCTION: This exam was performed according to the departmental dose-optimization program which includes automated exposure control, adjustment of the mA and/or kV according to patient size and/or use of iterative reconstruction technique. CONTRAST:  OMNIPAQUE IOHEXOL 350 MG/ML SOLN COMPARISON:  Portable chest x-ray 0254 hours today. CT Abdomen and Pelvis 10/22/2011. FINDINGS: CTA CHEST FINDINGS Cardiovascular: Mild cardiac pulsation artifact. Normal thoracic aorta. Normal heart size. No pericardial effusion. Other major mediastinal vascular structures are enhancing as expected and appear unremarkable. Mediastinum/Nodes: Negative. No mediastinal mass or lymphadenopathy. Unremarkable thoracic esophagus which is mostly decompressed throughout the chest. Lungs/Pleura: Symmetric atelectasis and mild respiratory motion. Major airways remain patent. No pleural effusion, consolidation. Musculoskeletal: Negative. Review of the MIP images confirms the above findings.  CTA ABDOMEN AND PELVIS FINDINGS VASCULAR Normal abdominal aorta and aortoiliac bifurcation. Major aortic branches appear patent and normal (duplicated right main renal artery, normal variant). Iliac and proximal femoral arteries are patent. Review of the MIP images confirms the above findings. NON-VASCULAR Hepatobiliary: Negative early postcontrast appearance of the liver and gallbladder. Pancreas: Negative. Spleen: Negative.  Splenule, normal variant (series 4, image 139). Adrenals/Urinary Tract: Normal adrenal glands. Kidneys appear nonobstructed and demonstrate symmetric early enhancement. Decompressed ureters. No nephrolithiasis. Unremarkable bladder. Bilateral pelvic phleboliths. Stomach/Bowel: Negative large bowel. Normal appendix on series 4, image 218. No dilated small bowel. Small to moderate volume gastric contents. Duodenum is decompressed. No gastro duodenal inflammation identified. No free air, free fluid, or mesenteric inflammation elsewhere. Lymphatic: No lymphadenopathy. Reproductive: Negative. Other: No pelvis free fluid. Musculoskeletal: Negative. Review of the MIP images confirms the above findings. IMPRESSION: 1. Normal CTA chest, abdomen, and pelvis. 2. Pulmonary atelectasis. Electronically Signed   By: Odessa Fleming M.D.   On: 04/29/2023 05:09   DG Chest Port 1 View  Result Date: 04/29/2023 CLINICAL DATA:  Chest pain and shortness of breath. EXAM: PORTABLE CHEST 1 VIEW COMPARISON:  December 12, 2013 FINDINGS: The heart size and mediastinal contours are within normal limits. Both lungs are clear. The visualized skeletal structures are unremarkable. IMPRESSION: No active disease. Electronically Signed   By: Aram Candela M.D.   On: 04/29/2023 03:06    Review of Systems  Cardiovascular:  Positive for chest pain.  Gastrointestinal:  Positive for abdominal pain.  All other systems reviewed and are negative.   Blood pressure (!) 140/93, pulse 97, temperature 98.5 F (36.9 C), temperature  source Oral, resp. rate 17, height 5\' 10"  (1.778 m), weight 124.7 kg, SpO2 97 %. Physical Exam Vitals reviewed.  Constitutional:      Appearance: He is well-developed.  HENT:     Head: Normocephalic and atraumatic.  Eyes:     Extraocular Movements: Extraocular movements intact.     Pupils: Pupils are equal, round, and reactive to light.     Comments: anicteric  Cardiovascular:     Rate and Rhythm: Normal rate and regular rhythm.  Pulmonary:     Effort: Pulmonary effort is normal.  Abdominal:     Palpations: Abdomen is soft. There is no fluid wave, hepatomegaly, splenomegaly or mass.     Tenderness: There is abdominal tenderness (mild RUQ tenderness). There is no guarding or rebound.  Musculoskeletal:  General: Normal range of motion.     Cervical back: Neck supple.  Skin:    General: Skin is warm and dry.  Neurological:     Mental Status: He is alert.      Assessment/Plan Early acute calculous cholecystitis with h/o gallstone pancreatitis Will need lap chole.   ***  Almond Lint, MD 04/30/2023, 10:29 PM

## 2023-05-01 ENCOUNTER — Other Ambulatory Visit: Payer: Self-pay

## 2023-05-01 ENCOUNTER — Observation Stay (HOSPITAL_COMMUNITY): Payer: Medicaid Other | Admitting: Anesthesiology

## 2023-05-01 ENCOUNTER — Encounter (HOSPITAL_COMMUNITY): Admission: EM | Disposition: A | Discharge status: Home / Self Care | Payer: Self-pay | Source: Home / Self Care

## 2023-05-01 ENCOUNTER — Encounter (HOSPITAL_COMMUNITY): Payer: Self-pay

## 2023-05-01 ENCOUNTER — Other Ambulatory Visit: Payer: Self-pay | Admitting: General Surgery

## 2023-05-01 ENCOUNTER — Observation Stay (HOSPITAL_BASED_OUTPATIENT_CLINIC_OR_DEPARTMENT_OTHER): Payer: Medicaid Other | Admitting: Anesthesiology

## 2023-05-01 DIAGNOSIS — K224 Dyskinesia of esophagus: Secondary | ICD-10-CM | POA: Diagnosis present

## 2023-05-01 DIAGNOSIS — F209 Schizophrenia, unspecified: Secondary | ICD-10-CM | POA: Diagnosis present

## 2023-05-01 DIAGNOSIS — K828 Other specified diseases of gallbladder: Secondary | ICD-10-CM | POA: Diagnosis present

## 2023-05-01 DIAGNOSIS — Z8249 Family history of ischemic heart disease and other diseases of the circulatory system: Secondary | ICD-10-CM | POA: Diagnosis not present

## 2023-05-01 DIAGNOSIS — K8000 Calculus of gallbladder with acute cholecystitis without obstruction: Secondary | ICD-10-CM | POA: Diagnosis present

## 2023-05-01 DIAGNOSIS — R7401 Elevation of levels of liver transaminase levels: Secondary | ICD-10-CM | POA: Diagnosis present

## 2023-05-01 DIAGNOSIS — M25511 Pain in right shoulder: Secondary | ICD-10-CM | POA: Diagnosis present

## 2023-05-01 DIAGNOSIS — K810 Acute cholecystitis: Secondary | ICD-10-CM

## 2023-05-01 DIAGNOSIS — K660 Peritoneal adhesions (postprocedural) (postinfection): Secondary | ICD-10-CM | POA: Diagnosis present

## 2023-05-01 DIAGNOSIS — Z825 Family history of asthma and other chronic lower respiratory diseases: Secondary | ICD-10-CM | POA: Diagnosis not present

## 2023-05-01 DIAGNOSIS — M25512 Pain in left shoulder: Secondary | ICD-10-CM | POA: Diagnosis present

## 2023-05-01 DIAGNOSIS — Z888 Allergy status to other drugs, medicaments and biological substances status: Secondary | ICD-10-CM | POA: Diagnosis not present

## 2023-05-01 DIAGNOSIS — Z79899 Other long term (current) drug therapy: Secondary | ICD-10-CM | POA: Diagnosis not present

## 2023-05-01 DIAGNOSIS — K8012 Calculus of gallbladder with acute and chronic cholecystitis without obstruction: Secondary | ICD-10-CM | POA: Diagnosis present

## 2023-05-01 HISTORY — PX: CHOLECYSTECTOMY: SHX55

## 2023-05-01 LAB — COMPREHENSIVE METABOLIC PANEL WITH GFR
ALT: 315 U/L — ABNORMAL HIGH (ref 0–44)
AST: 179 U/L — ABNORMAL HIGH (ref 15–41)
Albumin: 3.8 g/dL (ref 3.5–5.0)
Alkaline Phosphatase: 90 U/L (ref 38–126)
Anion gap: 8 (ref 5–15)
BUN: 9 mg/dL (ref 6–20)
CO2: 26 mmol/L (ref 22–32)
Calcium: 9 mg/dL (ref 8.9–10.3)
Chloride: 102 mmol/L (ref 98–111)
Creatinine, Ser: 1.03 mg/dL (ref 0.61–1.24)
GFR, Estimated: >60 mL/min
Glucose, Bld: 106 mg/dL — ABNORMAL HIGH (ref 70–99)
Potassium: 3.8 mmol/L (ref 3.5–5.1)
Sodium: 136 mmol/L (ref 135–145)
Total Bilirubin: 0.9 mg/dL (ref 0.3–1.2)
Total Protein: 7 g/dL (ref 6.5–8.1)

## 2023-05-01 LAB — SURGICAL PCR SCREEN
MRSA, PCR: NEGATIVE
Staphylococcus aureus: POSITIVE — AB

## 2023-05-01 LAB — CBC
HCT: 44.4 % (ref 39.0–52.0)
Hemoglobin: 14.7 g/dL (ref 13.0–17.0)
MCH: 28.7 pg (ref 26.0–34.0)
MCHC: 33.1 g/dL (ref 30.0–36.0)
MCV: 86.7 fL (ref 80.0–100.0)
Platelets: 247 10*3/uL (ref 150–400)
RBC: 5.12 MIL/uL (ref 4.22–5.81)
RDW: 12.5 % (ref 11.5–15.5)
WBC: 7.7 10*3/uL (ref 4.0–10.5)
nRBC: 0 % (ref 0.0–0.2)

## 2023-05-01 LAB — HIV ANTIBODY (ROUTINE TESTING W REFLEX): HIV Screen 4th Generation wRfx: NONREACTIVE

## 2023-05-01 SURGERY — LAPAROSCOPIC CHOLECYSTECTOMY WITH INTRAOPERATIVE CHOLANGIOGRAM
Anesthesia: General | Site: Abdomen

## 2023-05-01 MED ORDER — LIDOCAINE 2% (20 MG/ML) 5 ML SYRINGE
INTRAMUSCULAR | Status: DC | PRN
  Administered 2023-05-01: 80 mg via INTRAVENOUS

## 2023-05-01 MED ORDER — SUGAMMADEX SODIUM 200 MG/2ML IV SOLN
INTRAVENOUS | Status: DC | PRN
  Administered 2023-05-01: 300 mg via INTRAVENOUS

## 2023-05-01 MED ORDER — ARIPIPRAZOLE 10 MG PO TABS: 15.0000 mg | ORAL_TABLET | Freq: Every day | ORAL | Status: DC

## 2023-05-01 MED ORDER — PROCHLORPERAZINE EDISYLATE 10 MG/2ML IJ SOLN
5.0000 mg | Freq: Four times a day (QID) | INTRAMUSCULAR | Status: DC | PRN
  Administered 2023-05-02: 10 mg via INTRAVENOUS
  Filled 2023-05-01: qty 2

## 2023-05-01 MED ORDER — PROPRANOLOL HCL 20 MG PO TABS
10.0000 mg | ORAL_TABLET | Freq: Two times a day (BID) | ORAL | Status: DC
  Filled 2023-05-01: qty 1

## 2023-05-01 MED ORDER — FENTANYL CITRATE (PF) 100 MCG/2ML IJ SOLN
25.0000 ug | INTRAMUSCULAR | Status: DC | PRN
  Administered 2023-05-01: 25 ug via INTRAVENOUS

## 2023-05-01 MED ORDER — FENTANYL CITRATE (PF) 250 MCG/5ML IJ SOLN
INTRAMUSCULAR | Status: AC
  Filled 2023-05-01: qty 5

## 2023-05-01 MED ORDER — DEXMEDETOMIDINE HCL IN NACL 80 MCG/20ML IV SOLN
INTRAVENOUS | Status: AC
  Filled 2023-05-01: qty 20

## 2023-05-01 MED ORDER — CHLORHEXIDINE GLUCONATE CLOTH 2 % EX PADS: 6.0000 | MEDICATED_PAD | Freq: Every day | CUTANEOUS | Status: DC

## 2023-05-01 MED ORDER — MELATONIN 3 MG PO TABS: 3.0000 mg | ORAL_TABLET | Freq: Every evening | ORAL | Status: DC | PRN

## 2023-05-01 MED ORDER — LITHIUM CARBONATE ER 450 MG PO TBCR
450.0000 mg | EXTENDED_RELEASE_TABLET | Freq: Two times a day (BID) | ORAL | Status: DC
  Filled 2023-05-01 (×2): qty 1

## 2023-05-01 MED ORDER — ARIPIPRAZOLE 10 MG PO TABS
15.0000 mg | ORAL_TABLET | Freq: Every day | ORAL | Status: DC
  Filled 2023-05-01: qty 2

## 2023-05-01 MED ORDER — ACETAMINOPHEN 325 MG PO TABS: 650.0000 mg | ORAL_TABLET | Freq: Four times a day (QID) | ORAL | Status: DC | PRN

## 2023-05-01 MED ORDER — MORPHINE SULFATE (PF) 2 MG/ML IV SOLN
1.0000 mg | INTRAVENOUS | Status: DC | PRN
  Administered 2023-05-01 – 2023-05-02 (×3): 2 mg via INTRAVENOUS
  Filled 2023-05-01 (×3): qty 1

## 2023-05-01 MED ORDER — CHLORHEXIDINE GLUCONATE 0.12 % MT SOLN: 15.0000 mL | Freq: Once | OROMUCOSAL | Status: AC

## 2023-05-01 MED ORDER — ONDANSETRON 4 MG PO TBDP: 4.0000 mg | ORAL_TABLET | Freq: Four times a day (QID) | ORAL | Status: DC | PRN

## 2023-05-01 MED ORDER — ACETAMINOPHEN 650 MG RE SUPP: 650.0000 mg | Freq: Four times a day (QID) | RECTAL | Status: DC | PRN

## 2023-05-01 MED ORDER — ACETAMINOPHEN 10 MG/ML IV SOLN
1000.0000 mg | Freq: Once | INTRAVENOUS | Status: DC | PRN
  Administered 2023-05-01: 1000 mg via INTRAVENOUS

## 2023-05-01 MED ORDER — ORAL CARE MOUTH RINSE: 15.0000 mL | Freq: Once | OROMUCOSAL | Status: AC

## 2023-05-01 MED ORDER — PROPOFOL 10 MG/ML IV BOLUS
INTRAVENOUS | Status: DC | PRN
  Administered 2023-05-01: 200 mg via INTRAVENOUS

## 2023-05-01 MED ORDER — PROCHLORPERAZINE MALEATE 10 MG PO TABS: 10.0000 mg | ORAL_TABLET | Freq: Four times a day (QID) | ORAL | Status: DC | PRN

## 2023-05-01 MED ORDER — HYDROMORPHONE HCL 1 MG/ML IJ SOLN
0.5000 mg | Freq: Once | INTRAMUSCULAR | Status: DC
  Filled 2023-05-01: qty 0.5

## 2023-05-01 MED ORDER — CHLORHEXIDINE GLUCONATE 0.12 % MT SOLN
OROMUCOSAL | Status: AC
  Administered 2023-05-01: 15 mL via OROMUCOSAL
  Filled 2023-05-01: qty 15

## 2023-05-01 MED ORDER — PROPOFOL 10 MG/ML IV BOLUS
INTRAVENOUS | Status: AC
  Filled 2023-05-01: qty 20

## 2023-05-01 MED ORDER — ROCURONIUM BROMIDE 10 MG/ML (PF) SYRINGE
PREFILLED_SYRINGE | INTRAVENOUS | Status: AC
  Filled 2023-05-01: qty 10

## 2023-05-01 MED ORDER — KCL IN DEXTROSE-NACL 20-5-0.45 MEQ/L-%-% IV SOLN
INTRAVENOUS | Status: DC
  Filled 2023-05-01 (×2): qty 1000

## 2023-05-01 MED ORDER — MIDAZOLAM HCL 2 MG/2ML IJ SOLN
INTRAMUSCULAR | Status: AC
  Filled 2023-05-01: qty 2

## 2023-05-01 MED ORDER — ACETAMINOPHEN 10 MG/ML IV SOLN
INTRAVENOUS | Status: AC
  Filled 2023-05-01: qty 100

## 2023-05-01 MED ORDER — DIPHENHYDRAMINE HCL 50 MG/ML IJ SOLN: 25.0000 mg | Freq: Four times a day (QID) | INTRAMUSCULAR | Status: DC | PRN

## 2023-05-01 MED ORDER — PHENYLEPHRINE 80 MCG/ML (10ML) SYRINGE FOR IV PUSH (FOR BLOOD PRESSURE SUPPORT)
PREFILLED_SYRINGE | INTRAVENOUS | Status: AC
  Filled 2023-05-01: qty 10

## 2023-05-01 MED ORDER — METHOCARBAMOL 500 MG PO TABS
500.0000 mg | ORAL_TABLET | Freq: Three times a day (TID) | ORAL | Status: DC | PRN
  Administered 2023-05-01: 500 mg via ORAL
  Filled 2023-05-01 (×2): qty 1

## 2023-05-01 MED ORDER — ONDANSETRON HCL 4 MG/2ML IJ SOLN: 4.0000 mg | Freq: Four times a day (QID) | INTRAMUSCULAR | Status: DC | PRN

## 2023-05-01 MED ORDER — BUPIVACAINE-EPINEPHRINE (PF) 0.25% -1:200000 IJ SOLN
INTRAMUSCULAR | Status: AC
  Filled 2023-05-01: qty 30

## 2023-05-01 MED ORDER — BUSPIRONE HCL 5 MG PO TABS
10.0000 mg | ORAL_TABLET | Freq: Two times a day (BID) | ORAL | Status: DC
  Filled 2023-05-01: qty 2

## 2023-05-01 MED ORDER — 0.9 % SODIUM CHLORIDE (POUR BTL) OPTIME
TOPICAL | Status: DC | PRN
  Administered 2023-05-01: 1000 mL

## 2023-05-01 MED ORDER — PHENYLEPHRINE 80 MCG/ML (10ML) SYRINGE FOR IV PUSH (FOR BLOOD PRESSURE SUPPORT)
PREFILLED_SYRINGE | INTRAVENOUS | Status: DC | PRN
  Administered 2023-05-01: 160 ug via INTRAVENOUS

## 2023-05-01 MED ORDER — INDOCYANINE GREEN 25 MG IV SOLR: 2.5000 mg | Freq: Once | INTRAVENOUS | Status: DC

## 2023-05-01 MED ORDER — MUPIROCIN 2 % EX OINT
1.0000 | TOPICAL_OINTMENT | Freq: Two times a day (BID) | CUTANEOUS | Status: DC
  Administered 2023-05-01 – 2023-05-02 (×3): 1 via NASAL
  Filled 2023-05-01: qty 22

## 2023-05-01 MED ORDER — OXYCODONE HCL 5 MG PO TABS
5.0000 mg | ORAL_TABLET | ORAL | Status: DC | PRN
  Administered 2023-05-01: 10 mg via ORAL
  Filled 2023-05-01: qty 2

## 2023-05-01 MED ORDER — FENTANYL CITRATE (PF) 250 MCG/5ML IJ SOLN
INTRAMUSCULAR | Status: DC | PRN
  Administered 2023-05-01 (×2): 50 ug via INTRAVENOUS
  Administered 2023-05-01 (×3): 100 ug via INTRAVENOUS

## 2023-05-01 MED ORDER — INDOCYANINE GREEN 25 MG IV SOLR
INTRAVENOUS | Status: DC | PRN
  Administered 2023-05-01: 5 mg via INTRAVENOUS

## 2023-05-01 MED ORDER — BUPIVACAINE-EPINEPHRINE 0.25% -1:200000 IJ SOLN
INTRAMUSCULAR | Status: DC | PRN
  Administered 2023-05-01: 14 mL

## 2023-05-01 MED ORDER — TRAMADOL HCL 50 MG PO TABS: 50.0000 mg | ORAL_TABLET | Freq: Four times a day (QID) | ORAL | Status: DC | PRN

## 2023-05-01 MED ORDER — DEXAMETHASONE SODIUM PHOSPHATE 10 MG/ML IJ SOLN
INTRAMUSCULAR | Status: DC | PRN
  Administered 2023-05-01: 10 mg via INTRAVENOUS

## 2023-05-01 MED ORDER — LIDOCAINE 2% (20 MG/ML) 5 ML SYRINGE
INTRAMUSCULAR | Status: AC
  Filled 2023-05-01: qty 5

## 2023-05-01 MED ORDER — ONDANSETRON HCL 4 MG/2ML IJ SOLN
INTRAMUSCULAR | Status: DC | PRN
  Administered 2023-05-01: 4 mg via INTRAVENOUS

## 2023-05-01 MED ORDER — METHOCARBAMOL 1000 MG/10ML IJ SOLN
500.0000 mg | Freq: Three times a day (TID) | INTRAVENOUS | Status: DC | PRN
  Filled 2023-05-01: qty 5

## 2023-05-01 MED ORDER — SODIUM CHLORIDE 0.9 % IV SOLN
2.0000 g | INTRAVENOUS | Status: DC
  Administered 2023-05-02: 2 g via INTRAVENOUS
  Filled 2023-05-01 (×2): qty 20

## 2023-05-01 MED ORDER — LACTATED RINGERS IV SOLN: INTRAVENOUS | Status: DC

## 2023-05-01 MED ORDER — LITHIUM CARBONATE ER 450 MG PO TBCR: 450.0000 mg | EXTENDED_RELEASE_TABLET | Freq: Two times a day (BID) | ORAL | Status: DC

## 2023-05-01 MED ORDER — MIDAZOLAM HCL 2 MG/2ML IJ SOLN
INTRAMUSCULAR | Status: DC | PRN
  Administered 2023-05-01: 2 mg via INTRAVENOUS

## 2023-05-01 MED ORDER — DIPHENHYDRAMINE HCL 25 MG PO CAPS
25.0000 mg | ORAL_CAPSULE | Freq: Four times a day (QID) | ORAL | Status: DC | PRN
  Administered 2023-05-02 (×2): 25 mg via ORAL
  Filled 2023-05-01 (×2): qty 1

## 2023-05-01 MED ORDER — ENOXAPARIN SODIUM 40 MG/0.4ML IJ SOSY
40.0000 mg | PREFILLED_SYRINGE | INTRAMUSCULAR | Status: DC
  Administered 2023-05-02: 40 mg via SUBCUTANEOUS
  Filled 2023-05-01: qty 0.4

## 2023-05-01 MED ORDER — FENTANYL CITRATE (PF) 100 MCG/2ML IJ SOLN
INTRAMUSCULAR | Status: AC
  Filled 2023-05-01: qty 2

## 2023-05-01 MED ORDER — SODIUM CHLORIDE 0.9 % IR SOLN
Status: DC | PRN
  Administered 2023-05-01: 1000 mL

## 2023-05-01 MED ORDER — ROCURONIUM BROMIDE 10 MG/ML (PF) SYRINGE
PREFILLED_SYRINGE | INTRAVENOUS | Status: DC | PRN
  Administered 2023-05-01: 40 mg via INTRAVENOUS
  Administered 2023-05-01: 60 mg via INTRAVENOUS

## 2023-05-01 MED ORDER — ONDANSETRON HCL 4 MG/2ML IJ SOLN
INTRAMUSCULAR | Status: AC
  Filled 2023-05-01: qty 2

## 2023-05-01 MED ORDER — DEXMEDETOMIDINE HCL IN NACL 80 MCG/20ML IV SOLN
INTRAVENOUS | Status: DC | PRN
  Administered 2023-05-01 (×3): 8 ug via INTRAVENOUS

## 2023-05-01 MED ORDER — DEXAMETHASONE SODIUM PHOSPHATE 10 MG/ML IJ SOLN
INTRAMUSCULAR | Status: AC
  Filled 2023-05-01: qty 1

## 2023-05-01 SURGICAL SUPPLY — 47 items
ADH SKN CLS APL DERMABOND .7 (GAUZE/BANDAGES/DRESSINGS)
ADH SKN CLS LQ APL DERMABOND (GAUZE/BANDAGES/DRESSINGS) ×1
APL PRP STRL LF DISP 70% ISPRP (MISCELLANEOUS) ×1
APPLIER CLIP 5 13 M/L LIGAMAX5 (MISCELLANEOUS) ×1
APR CLP MED LRG 5 ANG JAW (MISCELLANEOUS) ×1
BAG COUNTER SPONGE SURGICOUNT (BAG) ×1 IMPLANT
BAG SPNG CNTER NS LX DISP (BAG) ×1
BLADE CLIPPER SURG (BLADE) IMPLANT
CANISTER SUCT 3000ML PPV (MISCELLANEOUS) ×1 IMPLANT
CHLORAPREP W/TINT 26 (MISCELLANEOUS) ×1 IMPLANT
CLIP APPLIE 5 13 M/L LIGAMAX5 (MISCELLANEOUS) ×1 IMPLANT
CLIP LIGATING HEMO LOK XL GOLD (MISCELLANEOUS) IMPLANT
COVER MAYO STAND STRL (DRAPES) ×1 IMPLANT
COVER SURGICAL LIGHT HANDLE (MISCELLANEOUS) ×1 IMPLANT
DERMABOND ADVANCED .7 DNX12 (GAUZE/BANDAGES/DRESSINGS) ×1 IMPLANT
DERMABOND ADVANCED .7 DNX6 (GAUZE/BANDAGES/DRESSINGS) IMPLANT
DISSECTOR BLUNT TIP ENDO 5MM (MISCELLANEOUS) IMPLANT
DRAPE C-ARM 42X120 X-RAY (DRAPES) ×1 IMPLANT
ELECT REM PT RETURN 9FT ADLT (ELECTROSURGICAL) ×1
ELECTRODE REM PT RTRN 9FT ADLT (ELECTROSURGICAL) ×1 IMPLANT
GLOVE BIO SURGEON STRL SZ7.5 (GLOVE) ×1 IMPLANT
GLOVE INDICATOR 8.0 STRL GRN (GLOVE) ×1 IMPLANT
GOWN STRL REUS W/ TWL LRG LVL3 (GOWN DISPOSABLE) ×2 IMPLANT
GOWN STRL REUS W/ TWL XL LVL3 (GOWN DISPOSABLE) ×1 IMPLANT
GOWN STRL REUS W/TWL LRG LVL3 (GOWN DISPOSABLE) ×1
GOWN STRL REUS W/TWL XL LVL3 (GOWN DISPOSABLE) ×2
IRRIG SUCT STRYKERFLOW 2 WTIP (MISCELLANEOUS) ×1
IRRIGATION SUCT STRKRFLW 2 WTP (MISCELLANEOUS) ×1 IMPLANT
KIT BASIN OR (CUSTOM PROCEDURE TRAY) ×1 IMPLANT
KIT IMAGING PINPOINTPAQ (MISCELLANEOUS) IMPLANT
KIT TURNOVER KIT B (KITS) ×1 IMPLANT
NS IRRIG 1000ML POUR BTL (IV SOLUTION) ×1 IMPLANT
PAD ARMBOARD 7.5X6 YLW CONV (MISCELLANEOUS) ×1 IMPLANT
PENCIL BUTTON HOLSTER BLD 10FT (ELECTRODE) ×1 IMPLANT
SCISSORS LAP 5X35 DISP (ENDOMECHANICALS) ×1 IMPLANT
SET CHOLANGIOGRAPH 5 50 .035 (SET/KITS/TRAYS/PACK) ×1 IMPLANT
SET TUBE SMOKE EVAC HIGH FLOW (TUBING) ×1 IMPLANT
SUT MNCRL AB 4-0 PS2 18 (SUTURE) ×1 IMPLANT
SYS BAG RETRIEVAL 10MM (BASKET) ×1
SYSTEM BAG RETRIEVAL 10MM (BASKET) IMPLANT
TOWEL GREEN STERILE (TOWEL DISPOSABLE) ×1 IMPLANT
TOWEL GREEN STERILE FF (TOWEL DISPOSABLE) ×1 IMPLANT
TRAY LAPAROSCOPIC MC (CUSTOM PROCEDURE TRAY) ×1 IMPLANT
TROCAR ADV FIXATION 5X100MM (TROCAR) ×3 IMPLANT
TROCAR BALLN 12MMX100 BLUNT (TROCAR) ×1 IMPLANT
WARMER LAPAROSCOPE (MISCELLANEOUS) ×1 IMPLANT
WATER STERILE IRR 1000ML POUR (IV SOLUTION) ×1 IMPLANT

## 2023-05-01 NOTE — Op Note (Signed)
05/01/2023 11:50 AM  PATIENT: Brett Vincent  32 y.o. male  Patient Care Team: [facility] as PCP - General (Family Medicine)  PRE-OPERATIVE DIAGNOSIS: Acute cholecystitis  POST-OPERATIVE DIAGNOSIS: Same  PROCEDURE: Laparoscopic cholecystectomy with indocyanine green cholangiography  SURGEON: Marin Olp, MD  ASSISTANT: OR Staff  ANESTHESIA: General endotracheal  EBL: 10 mL  DRAINS: None  SPECIMEN: Gallbladder  COUNTS: Sponge, needle and instrument counts were reported correct x2 at the conclusion of the operation  DISPOSITION: PACU in satisfactory condition  COMPLICATIONS: None  FINDINGS: Acute on chronic cholecystitis.  ICG cholangiography demonstrates uptake by the liver and excretion into the biliary system.  Faint tracer is seen to the wall of the duodenum consistent with at least a patent biliary tree.  No tracer was seen filling the gallbladder consistent with an obstructed cystic duct.  Critical view of safety was achieved prior to clipping or dividing any structures.  DESCRIPTION:  The patient was identified & brought into the operating room. He was then positioned supine on the OR table. SCDs were in place and active during the entire case. He then underwent general endotracheal anesthesia. Pressure points were padded. Hair on the abdomen was clipped by the OR team. The abdomen was prepped and draped in the standard sterile fashion. Antibiotics were administered. A surgical timeout was performed and confirmed our plan.   A periumbilical incision was made. The umbilical stalk was grasped and retracted outwardly. The supraumbilical fascia was identified and incised. The peritoneal cavity was gently entered bluntly. A purse-string 0 Vicryl suture was placed. The Hasson cannula was inserted into the peritoneal cavity and insufflation with CO2 commenced to . A laparoscope was inserted into the peritoneal cavity and inspection  confirmed no evidence of trocar site complications. The patient was then positioned in reverse Trendelenburg with slight left side down. 3 additional 5mm trocars were placed along the right subcostal line - one 5mm port in mid subcostal region, another 5mm port in the right flank near the anterior axillary line, and a third 5mm port in the left subxiphoid region obliquely near the falciform ligament.  The liver and gallbladder were inspected.  The liver is normal appearance.  The gallbladder has omental containing adhesions with fibrosis between it consistent with prior/chronic cholecystitis.  The wall of the gallbladder is somewhat thickened and edematous.  Omental containing adhesions were carefully taken off of the gallbladder. The gallbladder fundus was grasped and elevated cephalad. An additional grasper was then placed on the infundibulum of the gallbladder and the infundibulum was retracted laterally. Staying high on the gallbladder, the peritoneum on both sides of the gallbladder was opened with hook cautery. Gentle blunt dissection was then employed with a Art gallery manager working down into [employer]. The cystic duct was identified and carefully circumferentially dissected. The cystic artery was also identified and carefully circumferentially dissected. The space between the cystic artery and hepatocystic plate was developed such that a good view of the liver could be seen through a window medial to the cystic artery. The triangle of Calot had been cleared of all fibrofatty tissue. At this point, a critical view of safety was achieved and the only structures visualized was the skeletonized cystic duct laterally, the skeletonized cystic artery and the liver through the window medial to the artery. No posterior cystic artery was noted  Under near-infrared light, indocyanine green cholangiography demonstrates uptake of the tracer in the liver and excretion into the biliary system with faint tracer  activity  seen up towards the porta hepatis.  There is also faint tracer seen through the wall of the duodenum consistent with a patent biliary tree.  There is no opacification of the cystic artery and minimal opacification of the cystic duct.  There is no filling of the gallbladder.  This was felt to be most consistent with an occluded cystic duct (cholecystitis).   The cystic duct and artery were clipped with titanium 10 mm clips on the cystic artery and plastic Hem-o-lok clips on the cystic duct.  2 clips on the patient side and 1 clip on the specimen side. The cystic duct and artery were then divided. The gallbladder was then freed from its remaining attachments to the liver using electrocautery and placed into an endocatch bag. The RUQ was gently irrigated with sterile saline. Hemostasis was then verified. The clips were in good position; the gallbladder fossa was dry. The rest of the abdomen was inspected and no injury nor bleeding elsewhere was identified.  The endocatch bag containing the gallbladder was then removed from the umbilical port site and passed off as specimen. The RUQ ports were removed under direct visualization and noted to be hemostatic. The umbilical fascia was then closed using the 0 Vicryl purse-string suture. The fascia was palpated and noted to be completely closed. The skin of all incision sites was approximated with 4-0 monocryl subcuticular suture and dermabond applied. He was then awakened from anesthesia, extubated, and transferred to a stretcher for transport to PACU in satisfactory condition.

## 2023-05-01 NOTE — Discharge Instructions (Signed)
[facility] ° °Please arrive at least 30 min before your appointment to complete your check in paperwork.  If you are unable to arrive 30 min prior to your appointment time we may have to cancel or reschedule you. °LAPAROSCOPIC SURGERY: POST OP INSTRUCTIONS °Always review your discharge instruction sheet given to you by the facility where your surgery was performed. °IF YOU HAVE DISABILITY OR FAMILY LEAVE FORMS, YOU MUST BRING THEM TO THE OFFICE FOR PROCESSING.   °DO NOT GIVE THEM TO YOUR DOCTOR. ° °PAIN CONTROL ° °First take acetaminophen (Tylenol) AND/or ibuprofen (Advil) to control your pain after surgery.  Follow directions on package.  Taking acetaminophen (Tylenol) and/or ibuprofen (Advil) regularly after surgery will help to control your pain and lower the amount of prescription pain medication you may need.  You should not take more than 4,000 mg (4 grams) of acetaminophen (Tylenol) in 24 hours.  You should not take ibuprofen (Advil), aleve, motrin, naprosyn or other NSAIDS if you have a history of stomach ulcers or chronic kidney disease.  °A prescription for pain medication may be given to you upon discharge.  Take your pain medication as prescribed, if you still have uncontrolled pain after taking acetaminophen (Tylenol) or ibuprofen (Advil). °Use ice packs to help control pain. °If you need a refill on your pain medication, please contact your pharmacy.  They will contact our office to request authorization. Prescriptions will not be filled after 5pm or on week-ends. ° °HOME MEDICATIONS °Take your usually prescribed medications unless otherwise directed. ° °DIET °You should follow a light diet the first few days after arrival home.  Be sure to include lots of fluids daily. Avoid fatty, fried foods.  ° °CONSTIPATION °It is common to experience some constipation after surgery and if you are taking pain medication.  Increasing fluid intake and taking a stool softener (such as Colace)  will usually help or prevent this problem from occurring.  A mild laxative (Milk of Magnesia or Miralax) should be taken according to package instructions if there are no bowel movements after 48 hours. ° °WOUND/INCISION CARE °Most patients will experience some swelling and bruising in the area of the incisions.  Ice packs will help.  Swelling and bruising can take several days to resolve.  °Unless discharge instructions indicate otherwise, follow guidelines below  °STERI-STRIPS - you may remove your outer bandages 48 hours after surgery, and you may shower at that time.  You have steri-strips (small skin tapes) in place directly over the incision.  These strips should be left on the skin for 7-10 days.   °DERMABOND/SKIN GLUE - you may shower in 24 hours.  The glue will flake off over the next 2-3 weeks. °Any sutures or staples will be removed at the office during your follow-up visit. ° °ACTIVITIES °You may resume regular (light) daily activities beginning the next day--such as daily self-care, walking, climbing stairs--gradually increasing activities as tolerated.  You may have sexual intercourse when it is comfortable.  Refrain from any heavy lifting or straining until approved by your doctor. °You may drive when you are no longer taking prescription pain medication, you can comfortably wear a seatbelt, and you can safely maneuver your car and apply brakes. ° °FOLLOW-UP °You should see your doctor in the office for a follow-up appointment approximately 2-3 weeks after your surgery.  You should have been given your post-op/follow-up appointment when your surgery was scheduled.  If you did not receive a post-op/follow-up appointment, make sure   that you call for this appointment within a day or two after you arrive home to insure a convenient appointment time. ° ° °WHEN TO CALL YOUR DOCTOR: °Fever over 101.0 °Inability to urinate °Continued bleeding from incision. °Increased pain, redness, or drainage from the  incision. °Increasing abdominal pain ° °The clinic staff is available to answer your questions during regular business hours.  Please don’t hesitate to call and ask to speak to one of the nurses for clinical concerns.  If you have a medical emergency, go to the nearest emergency room or call 911.  A surgeon from [facility] is always on call at the hospital. °[address], [address], [place], [place]  [address] ? [address] [address], [place], [place]   [address] °[phone] ? [phone] ? FAX [phone] ° ° ° ° °Managing Your Pain After Surgery Without Opioids ° ° ° °Thank you for participating in our program to help patients manage their pain after surgery without opioids. This is part of our effort to provide you with the best care possible, without exposing you or your family to the risk that opioids pose. ° °What pain can I expect after surgery? °You can expect to have some pain after surgery. This is normal. The pain is typically worse the day after surgery, and quickly begins to get better. °Many studies have found that many patients are able to manage their pain after surgery with Over-the-Counter (OTC) medications such as Tylenol and Motrin. If you have a condition that does not allow you to take Tylenol or Motrin, notify your surgical team. ° °How will I manage my pain? °The best strategy for controlling your pain after surgery is around the clock pain control with Tylenol (acetaminophen) and Motrin (ibuprofen or Advil). Alternating these medications with each other allows you to maximize your pain control. In addition to Tylenol and Motrin, you can use heating pads or ice packs on your incisions to help reduce your pain. ° °How will I alternate your regular strength over-the-counter pain medication? °You will take a dose of pain medication every three hours. °Start by taking 650 mg of Tylenol (2 pills of 325 mg) °3 hours later take 600 mg of Motrin (3 pills of 200 mg) °3 hours after  taking the Motrin take 650 mg of Tylenol °3 hours after that take 600 mg of Motrin. ° ° °- 1 - ° °See example - if your first dose of Tylenol is at 12:00 PM ° ° °12:00 PM Tylenol 650 mg (2 pills of 325 mg)  °3:00 PM Motrin 600 mg (3 pills of 200 mg)  °6:00 PM Tylenol 650 mg (2 pills of 325 mg)  °9:00 PM Motrin 600 mg (3 pills of 200 mg)  °Continue alternating every 3 hours  ° °We recommend that you follow this schedule around-the-clock for at least 3 days after surgery, or until you feel that it is no longer needed. Use the table on the last page of this handout to keep track of the medications you are taking. °Important: °Do not take more than 3000mg of Tylenol or 3200mg of Motrin in a 24-hour period. °Do not take ibuprofen/Motrin if you have a history of bleeding stomach ulcers, severe kidney disease, &/or actively taking a blood thinner ° °What if I still have pain? °If you have pain that is not controlled with the over-the-counter pain medications (Tylenol and Motrin or Advil) you might have what we call “breakthrough” pain. You will receive a prescription   for a small amount of an opioid pain medication such as Oxycodone, Tramadol, or Tylenol with Codeine. Use these opioid pills in the first 24 hours after surgery if you have breakthrough pain. Do not take more than 1 pill every 4-6 hours. ° °If you still have uncontrolled pain after using all opioid pills, don't hesitate to call our staff using the number provided. We will help make sure you are managing your pain in the best way possible, and if necessary, we can provide a prescription for additional pain medication. ° ° °Day 1   ° °Time  °Name of Medication Number of pills taken  °Amount of Acetaminophen  °Pain Level  ° °Comments  °AM PM       °AM PM       °AM PM       °AM PM       °AM PM       °AM PM       °AM PM       °AM PM       °Total Daily amount of Acetaminophen °Do not take more than  3,000 mg per day    ° ° °Day 2   ° °Time  °Name of Medication  Number of pills °taken  °Amount of Acetaminophen  °Pain Level  ° °Comments  °AM PM       °AM PM       °AM PM       °AM PM       °AM PM       °AM PM       °AM PM       °AM PM       °Total Daily amount of Acetaminophen °Do not take more than  3,000 mg per day    ° ° °Day 3   ° °Time  °Name of Medication Number of pills taken  °Amount of Acetaminophen  °Pain Level  ° °Comments  °AM PM       °AM PM       °AM PM       °AM PM       ° ° ° °AM PM       °AM PM       °AM PM       °AM PM       °Total Daily amount of Acetaminophen °Do not take more than  3,000 mg per day    ° ° °Day 4   ° °Time  °Name of Medication Number of pills taken  °Amount of Acetaminophen  °Pain Level  ° °Comments  °AM PM       °AM PM       °AM PM       °AM PM       °AM PM       °AM PM       °AM PM       °AM PM       °Total Daily amount of Acetaminophen °Do not take more than  3,000 mg per day    ° ° °Day 5   ° °Time  °Name of Medication Number °of pills taken  °Amount of Acetaminophen  °Pain Level  ° °Comments  °AM PM       °AM PM       °AM PM       °AM PM       °AM PM       °AM   PM       °AM PM       °AM PM       °Total Daily amount of Acetaminophen °Do not take more than  3,000 mg per day    ° ° ° °Day 6   ° °Time  °Name of Medication Number of pills °taken  °Amount of Acetaminophen  °Pain Level  °Comments  °AM PM       °AM PM       °AM PM       °AM PM       °AM PM       °AM PM       °AM PM       °AM PM       °Total Daily amount of Acetaminophen °Do not take more than  3,000 mg per day    ° ° °Day 7   ° °Time  °Name of Medication Number of pills taken  °Amount of Acetaminophen  °Pain Level  ° °Comments  °AM PM       °AM PM       °AM PM       °AM PM       °AM PM       °AM PM       °AM PM       °AM PM       °Total Daily amount of Acetaminophen °Do not take more than  3,000 mg per day    ° ° ° ° °For additional information about how and where to safely dispose of unused opioid °medications - https://www.morepowerfulnc.org ° °Disclaimer: This document  contains information and/or instructional materials adapted from [facility] for the typical patient with your condition. It does not replace medical advice from your health care provider because your experience may differ from that of the °typical patient. Talk to your health care provider if you have any questions about this °document, your condition or your treatment plan. °Adapted from [facility] ° °

## 2023-05-01 NOTE — Anesthesia Preprocedure Evaluation (Addendum)
Anesthesia Evaluation  Patient identified by MRN, date of birth, ID band Patient awake    Reviewed: Allergy & Precautions, NPO status , Patient's Chart, lab work & pertinent test results  Airway Mallampati: II  TM Distance: >3 FB Neck ROM: Full    Dental no notable dental hx.    Pulmonary neg pulmonary ROS   Pulmonary exam normal        Cardiovascular negative cardio ROS  Rhythm:Regular Rate:Normal     Neuro/Psych  Headaches  negative psych ROS   GI/Hepatic Neg liver ROS,,,Cholecystitis    Endo/Other  negative endocrine ROS    Renal/GU   negative genitourinary   Musculoskeletal negative musculoskeletal ROS (+)    Abdominal Normal abdominal exam  (+)   Peds  Hematology negative hematology ROS (+)   Anesthesia Other Findings   Reproductive/Obstetrics                             Anesthesia Physical Anesthesia Plan  ASA: 1  Anesthesia Plan: General   Post-op Pain Management:    Induction: Intravenous  PONV Risk Score and Plan: 2 and Ondansetron, Dexamethasone, Midazolam and Treatment may vary due to age or medical condition  Airway Management Planned: Mask and Oral ETT  Additional Equipment: None  Intra-op Plan:   Post-operative Plan: Extubation in OR  Informed Consent: I have reviewed the patients History and Physical, chart, labs and discussed the procedure including the risks, benefits and alternatives for the proposed anesthesia with the patient or authorized representative who has indicated his/her understanding and acceptance.     Dental advisory given  Plan Discussed with: CRNA  Anesthesia Plan Comments:        Anesthesia Quick Evaluation

## 2023-05-01 NOTE — Anesthesia Postprocedure Evaluation (Signed)
Anesthesia Post Note  Patient: Brett Vincent  Procedure(s) Performed: LAPAROSCOPIC CHOLECYSTECTOMY WITH ICG DYE (Abdomen)     Patient location during evaluation: PACU Anesthesia Type: General Level of consciousness: awake and alert Pain management: pain level controlled Vital Signs Assessment: post-procedure vital signs reviewed and stable Respiratory status: spontaneous breathing, nonlabored ventilation, respiratory function stable and patient connected to nasal cannula oxygen Cardiovascular status: blood pressure returned to baseline and stable Postop Assessment: no apparent nausea or vomiting Anesthetic complications: no   No notable events documented.  Last Vitals:  Vitals:   05/01/23 1245 05/01/23 1300  BP: (!) 140/84 132/88  Pulse: 90 90  Resp: 12 14  Temp:  36.7 C  SpO2: 95% 94%    Last Pain:  Vitals:   05/01/23 1312  TempSrc:   PainSc: 9                  [name]

## 2023-05-01 NOTE — Progress Notes (Addendum)
Subjective No acute events. Feeling better than when he came in. Pain ~gone. No n/v. States he is ready to have his gallbladder removed.  Objective: Vital signs in last 24 hours: Temp:  [97.6 F (36.4 C)-98.6 F (37 C)] 98.6 F (37 C) (06/30 0800) Pulse Rate:  [83-104] 92 (06/30 0800) Resp:  [15-27] 16 (06/30 0800) BP: (117-158)/(67-100) 117/71 (06/30 0800) SpO2:  [94 %-99 %] 96 % (06/30 0800) Weight:  [124.7 kg] 124.7 kg (06/30 0821) Last BM Date : 04/30/23  Intake/Output from previous day: 06/29 0701 - 06/30 0700 In: 240 [P.O.:240] Out: -  Intake/Output this shift: No intake/output data recorded.  Gen: NAD, comfortable CV: RRR Pulm: Normal work of breathing Abd: Soft, NT/ND Ext: SCDs in place  Lab Results: CBC  Recent Labs    04/30/23 1634 05/01/23 0615  WBC 6.9 7.7  HGB 14.6 14.7  HCT 43.1 44.4  PLT 254 247   BMET Recent Labs    04/30/23 1634 05/01/23 0615  NA 138 136  K 3.7 3.8  CL 103 102  CO2 25 26  GLUCOSE 94 106*  BUN 10 9  CREATININE 0.90 1.03  CALCIUM 9.5 9.0   PT/INR No results for input(s): "LABPROT", "INR" in the last 72 hours. ABG No results for input(s): "PHART", "HCO3" in the last 72 hours.  Invalid input(s): "PCO2", "PO2"  Studies/Results:  Anti-infectives: Anti-infectives (From admission, onward)    Start     Dose/Rate Route Frequency Ordered Stop   05/01/23 1000  [MAR Hold]  cefTRIAXone (ROCEPHIN) 2 g in sodium chloride 0.9 % 100 mL IVPB        (MAR Hold since Sun 05/01/2023 at 0815.Hold Reason: Transfer to a Procedural area)   2 g 200 mL/hr over 30 Minutes Intravenous Every 24 hours 05/01/23 0227 05/08/23 0959   04/30/23 2100  cefTRIAXone (ROCEPHIN) 2 g in sodium chloride 0.9 % 100 mL IVPB        2 g 200 mL/hr over 30 Minutes Intravenous  Once 04/30/23 2056 04/30/23 2151        Assessment/Plan: Patient Active Problem List   Diagnosis Date Noted   Acute calculous cholecystitis 04/30/2023   Cholecystitis 04/30/2023    Headache(784.0) 12/19/2013   Syncope and collapse 12/19/2013   32yoM with acute cholecystitis, possible recent choledocholithiasis   [place] 05/01/23 - tiny stones/sludge; CBD 5 mm; no pericholecystic fluid, +Murphy's, no GBW thickening  Liver enzymes down-trending; bilirubin was 1.4, now normalized Lipase was normal  -The anatomy and physiology of the hepatobiliary system was discussed with the patient. The pathophysiology of gallbladder disease was then reviewed as well. -The options for treatment were discussed including ongoing observation which may result in subsequent gallbladder complications (infection, pancreatitis, choledocholithiasis, etc), drainage procedures, and surgery - laparoscopic cholecystectomy with indocyanine green cholangiography, possible intraoperative cholangiogram -The planned procedure, material risks (including, but not limited to, pain, bleeding, infection, scarring, need for blood transfusion, damage to surrounding structures- blood vessels/nerves/viscus/organs, damage to bile duct, bile leak, chronic diarrhea, conversion to a 'subtotal' cholecystectomy and general expectations therein, post-cholecystectomy diarrhea, potential need for additional procedures including EGD/ERCP, hernia, worsening of pre-existing medical conditions, pancreatitis, pneumonia, heart attack, stroke, death) benefits and alternatives to surgery were discussed at length. We have noted a good probability that the procedure would help improve their symptoms. The patient's questions were answered to his satisfaction, he voiced understanding and wishes to proceed with surgery. Additionally, we discussed typical postoperative expectations and the recovery process.   LOS: 1 day  I spent a total of 50 minutes in both face-to-face and non-face-to-face activities, excluding procedures performed, for this visit on the date of this encounter.   Marin Olp, MD [facility], [facility]

## 2023-05-01 NOTE — Progress Notes (Signed)
Patient transfer from [facility] and oriented,patient assessment done,made comfortable in room,will continue to monitor.

## 2023-05-01 NOTE — Transfer of Care (Signed)
Immediate Anesthesia Transfer of Care Note  Patient: Brett Vincent  Procedure(s) Performed: LAPAROSCOPIC CHOLECYSTECTOMY WITH ICG DYE (Abdomen)  Patient Location: PACU  Anesthesia Type:General  Level of Consciousness: drowsy and patient cooperative  Airway & Oxygen Therapy: Patient Spontanous Breathing and Patient connected to nasal cannula oxygen  Post-op Assessment: Report given to RN and Post -op Vital signs reviewed and stable  Post vital signs: Reviewed and stable  Last Vitals:  Vitals Value Taken Time  BP 137/90 05/01/23 1148  Temp 36.8 C 05/01/23 1148  Pulse 102 05/01/23 1150  Resp 28 05/01/23 1150  SpO2 92 % 05/01/23 1150  Vitals shown include unvalidated device data.  Last Pain:  Vitals:   05/01/23 0800  TempSrc: Oral  PainSc:          Complications: No notable events documented.

## 2023-05-01 NOTE — Anesthesia Procedure Notes (Signed)
Procedure Name: Intubation Date/Time: 05/01/2023 10:20 AM  Performed by: Rosiland Oz, CRNAPre-anesthesia Checklist: Patient identified, Emergency Drugs available, Suction available, Patient being monitored and Timeout performed Patient Re-evaluated:Patient Re-evaluated prior to induction Oxygen Delivery Method: Circle system utilized Preoxygenation: Pre-oxygenation with 100% oxygen Induction Type: IV induction Ventilation: Mask ventilation without difficulty Laryngoscope Size: Miller and 3 Grade View: Grade I Tube type: Oral Tube size: 7.5 mm Number of attempts: 1 Airway Equipment and Method: Stylet Placement Confirmation: ETT inserted through vocal cords under direct vision, positive ETCO2 and breath sounds checked- equal and bilateral Secured at: 22 cm Tube secured with: Tape Dental Injury: Teeth and Oropharynx as per pre-operative assessment

## 2023-05-02 ENCOUNTER — Encounter (HOSPITAL_COMMUNITY): Payer: Self-pay | Admitting: Surgery

## 2023-05-02 LAB — CBC
HCT: 43.1 % (ref 39.0–52.0)
Hemoglobin: 14.6 g/dL (ref 13.0–17.0)
MCH: 29.1 pg (ref 26.0–34.0)
MCHC: 33.9 g/dL (ref 30.0–36.0)
MCV: 86 fL (ref 80.0–100.0)
Platelets: 251 10*3/uL (ref 150–400)
RBC: 5.01 MIL/uL (ref 4.22–5.81)
RDW: 12.9 % (ref 11.5–15.5)
WBC: 10.9 10*3/uL — ABNORMAL HIGH (ref 4.0–10.5)
nRBC: 0 % (ref 0.0–0.2)

## 2023-05-02 LAB — COMPREHENSIVE METABOLIC PANEL
ALT: 617 U/L — ABNORMAL HIGH (ref 0–44)
AST: 605 U/L — ABNORMAL HIGH (ref 15–41)
Albumin: 3.9 g/dL (ref 3.5–5.0)
Alkaline Phosphatase: 110 U/L (ref 38–126)
Anion gap: 11 (ref 5–15)
BUN: 8 mg/dL (ref 6–20)
CO2: 25 mmol/L (ref 22–32)
Calcium: 9.6 mg/dL (ref 8.9–10.3)
Chloride: 99 mmol/L (ref 98–111)
Creatinine, Ser: 0.97 mg/dL (ref 0.61–1.24)
GFR, Estimated: >60 mL/min (ref >60)
Glucose, Bld: 129 mg/dL — ABNORMAL HIGH (ref 70–99)
Potassium: 4.4 mmol/L (ref 3.5–5.1)
Sodium: 135 mmol/L (ref 135–145)
Total Bilirubin: 2.5 mg/dL — ABNORMAL HIGH (ref 0.3–1.2)
Total Protein: 7.5 g/dL (ref 6.5–8.1)

## 2023-05-02 MED ORDER — LORAZEPAM 2 MG/ML IJ SOLN: 0.5000 mg | Freq: Once | INTRAMUSCULAR | Status: DC

## 2023-05-02 MED ORDER — KETOROLAC TROMETHAMINE 30 MG/ML IJ SOLN
INTRAMUSCULAR | Status: AC
  Filled 2023-05-02: qty 1

## 2023-05-02 MED ORDER — MORPHINE SULFATE (PF) 2 MG/ML IV SOLN
INTRAVENOUS | Status: AC
  Filled 2023-05-02: qty 1

## 2023-05-02 MED ORDER — FAMOTIDINE 20 MG PO TABS
20.0000 mg | ORAL_TABLET | Freq: Once | ORAL | Status: AC
  Administered 2023-05-02: 20 mg via ORAL
  Filled 2023-05-02: qty 1

## 2023-05-02 MED ORDER — HYDROCORTISONE 0.5 % EX CREA
TOPICAL_CREAM | Freq: Once | CUTANEOUS | Status: AC
  Filled 2023-05-02: qty 28.35

## 2023-05-02 MED ORDER — METHOCARBAMOL 500 MG PO TABS: 500.0000 mg | ORAL_TABLET | Freq: Four times a day (QID) | ORAL | Status: DC | PRN

## 2023-05-02 MED ORDER — DIPHENHYDRAMINE HCL 25 MG PO CAPS
25.0000 mg | ORAL_CAPSULE | Freq: Once | ORAL | Status: AC
  Administered 2023-05-02: 25 mg via ORAL
  Filled 2023-05-02: qty 1

## 2023-05-02 MED ORDER — MORPHINE SULFATE (PF) 2 MG/ML IV SOLN
1.0000 mg | INTRAVENOUS | Status: DC | PRN
  Administered 2023-05-02: 2 mg via INTRAVENOUS

## 2023-05-02 MED ORDER — KETOROLAC TROMETHAMINE 30 MG/ML IJ SOLN
30.0000 mg | Freq: Three times a day (TID) | INTRAMUSCULAR | Status: DC
  Administered 2023-05-02 – 2023-05-03 (×4): 30 mg via INTRAVENOUS
  Filled 2023-05-02 (×3): qty 1

## 2023-05-02 MED ORDER — TRAMADOL HCL 50 MG PO TABS: 50.0000 mg | ORAL_TABLET | Freq: Four times a day (QID) | ORAL | Status: DC | PRN

## 2023-05-02 MED ORDER — LORAZEPAM 2 MG/ML IJ SOLN
INTRAMUSCULAR | Status: AC
  Administered 2023-05-02: 2 mg
  Filled 2023-05-02: qty 1

## 2023-05-02 MED ORDER — ACETAMINOPHEN 500 MG PO TABS
1000.0000 mg | ORAL_TABLET | Freq: Four times a day (QID) | ORAL | Status: DC
  Administered 2023-05-02: 1000 mg via ORAL
  Filled 2023-05-02 (×3): qty 2

## 2023-05-02 MED ORDER — KETOROLAC TROMETHAMINE 15 MG/ML IJ SOLN
INTRAMUSCULAR | Status: AC
  Filled 2023-05-02: qty 1

## 2023-05-02 MED ORDER — PANTOPRAZOLE SODIUM 40 MG PO TBEC
40.0000 mg | DELAYED_RELEASE_TABLET | Freq: Every day | ORAL | Status: DC
  Administered 2023-05-02: 40 mg via ORAL
  Filled 2023-05-02 (×2): qty 1

## 2023-05-02 MED ORDER — METHOCARBAMOL 1000 MG/10ML IJ SOLN: 500.0000 mg | Freq: Three times a day (TID) | INTRAVENOUS | Status: DC | PRN

## 2023-05-02 NOTE — Progress Notes (Signed)
Messaged PA. Patients mom wants an update.

## 2023-05-02 NOTE — Progress Notes (Signed)
1 Day Post-Op  Subjective: Had hives overnight.  He thinks secondary to the CHG wipes.  Some nausea this morning.  Pain in his shoulders.    ROS: See above, otherwise other systems negative  Objective: Vital signs in last 24 hours: Temp:  [97.5 F (36.4 C)-98.6 F (37 C)] 98 F (36.7 C) (07/01 0826) Pulse Rate:  [88-107] 106 (07/01 0826) Resp:  [11-32] 21 (07/01 0826) BP: (128-153)/(69-96) 148/69 (07/01 0826) SpO2:  [86 %-96 %] 95 % (07/01 0826) Last BM Date : 04/30/23  Intake/Output from previous day: 06/30 0701 - 07/01 0700 In: 2180 [P.O.:480; I.V.:1700] Out: 10 [Blood:10] Intake/Output this shift: No intake/output data recorded.  PE: Gen: somewhat sleeping, reclined in his chair in NAD Abd: soft, obese, appropriately tender, incisions c/d/i  Lab Results:  Recent Labs    04/30/23 1634 05/01/23 0615  WBC 6.9 7.7  HGB 14.6 14.7  HCT 43.1 44.4  PLT 254 247   BMET Recent Labs    05/01/23 0615 05/02/23 0400  NA 136 135  K 3.8 4.4  CL 102 99  CO2 26 25  GLUCOSE 106* 129*  BUN 9 8  CREATININE 1.03 0.97  CALCIUM 9.0 9.6   PT/INR No results for input(s): "LABPROT", "INR" in the last 72 hours. CMP     Component Value Date/Time   NA 135 05/02/2023 0400   K 4.4 05/02/2023 0400   CL 99 05/02/2023 0400   CO2 25 05/02/2023 0400   GLUCOSE 129 (H) 05/02/2023 0400   BUN 8 05/02/2023 0400   CREATININE 0.97 05/02/2023 0400   CALCIUM 9.6 05/02/2023 0400   PROT 7.5 05/02/2023 0400   ALBUMIN 3.9 05/02/2023 0400   AST 605 (H) 05/02/2023 0400   ALT 617 (H) 05/02/2023 0400   ALKPHOS 110 05/02/2023 0400   BILITOT 2.5 (H) 05/02/2023 0400   GFRNONAA >60 05/02/2023 0400   GFRAA >60 01/31/2020 2304   Lipase     Component Value Date/Time   LIPASE 17 04/30/2023 1634       Studies/Results: US Abdomen Limited RUQ (LIVER/GB)  Result Date: 04/30/2023 CLINICAL DATA:  Elevated LFTs.  Esophageal spasms getting worse. EXAM: ULTRASOUND ABDOMEN LIMITED RIGHT UPPER  QUADRANT COMPARISON:  CT angio chest abdomen pelvis 04/29/2023, CT abdomen pelvis 10/22/2011 FINDINGS: Gallbladder: Hypoechoic material in the gallbladder neck with a few tiny echogenic foci, favored to reflect sludge with a few tiny gallstones. No wall thickening visualized. No pericholecystic fluid. Sonographer reports positive sonographic Murphy's sign. Common bile duct: Diameter: 0.5 cm Liver: No focal lesion identified. Diffuse increased echogenicity. Within normal limits in parenchymal echogenicity. Portal vein is patent on color Doppler imaging with normal direction of blood flow towards the liver. Other: None. IMPRESSION: 1. Hypoechoic material in the gallbladder neck with a few tiny echogenic foci, favored to reflect sludge with a few tiny gallstones. Sonographer reports positive sonographic Murphy's sign. Findings are favored to indicate early acute cholecystitis. 2. Hepatic steatosis. Electronically Signed   By: Sherron Ales M.D.   On: 04/30/2023 19:50   DG Chest 2 View  Result Date: 04/30/2023 CLINICAL DATA:  Chest pain EXAM: CHEST - 2 VIEW COMPARISON:  Chest x-ray April 29, 2023 FINDINGS: The cardiomediastinal silhouette is unchanged in contour. Low lung volumes with bronchovascular crowding. No focal pulmonary opacity. No pleural effusion or pneumothorax. The visualized upper abdomen is unremarkable. No acute osseous abnormality. IMPRESSION: No active cardiopulmonary disease. Electronically Signed   By: Jacob Moores M.D.   On: 04/30/2023 16:52  Anti-infectives: Anti-infectives (From admission, onward)    Start     Dose/Rate Route Frequency Ordered Stop   05/01/23 1000  cefTRIAXone (ROCEPHIN) 2 g in sodium chloride 0.9 % 100 mL IVPB        2 g 200 mL/hr over 30 Minutes Intravenous Every 24 hours 05/01/23 0227 05/08/23 0959   04/30/23 2100  cefTRIAXone (ROCEPHIN) 2 g in sodium chloride 0.9 % 100 mL IVPB        2 g 200 mL/hr over 30 Minutes Intravenous  Once 04/30/23 2056 04/30/23 2151         Assessment/Plan POD 1, s/p lap chole with ICG dye, Dr. Cliffton Asters 6/30 -TB up to 2.5 today and AST/ALT in 600s.  Repeat in AM.  If remains high, will order MRCP.  ICG dye emptied into the duodenum during surgery -discussed shoulder pain is generally diaphragm irritation and this will get better with time - mobilize, pulm toilet -multi-modal pain control, tylenol, toradol, robaxin, tramadol (says oxy doesn't do well for him) IV morphine for breakthrough  FEN - regular diet/IVFs VTE - lovenox ID - Rocephin    LOS: 1 day    Letha Cape , [facility] 05/02/2023, 8:58 AM Please see Amion for pager number during day hours 7:00am-4:30pm or 7:00am -11:30am on weekends

## 2023-05-02 NOTE — Progress Notes (Signed)
On call MD notified that pt is still itching after last dose of Benadryl.  He stated he would put in orders for pt.  Report given to oncoming RN regarding itching/redness and events of the night. [name] BSN RN CMSRN 05/02/2023, 7:36 AM

## 2023-05-02 NOTE — Progress Notes (Signed)
Pt called out stating he was having an allergic reaction to something.  Whelps noted to both underarms, lower back and generalized redness noted to arms and legs.  PRN Benadryl given.  Pt assisted with washing off with cool cloths.  Dr. Donell Beers on call notified and will give an additional dose of Benadryl as well as Pepcid and some Hydrocortisone cream.  Pt denies any issues with his breathing and has no swelling of lips/tongue as of this time.   [name] BSN RN CMSRN 05/02/2023, 12:40 AM

## 2023-05-02 NOTE — Progress Notes (Signed)
Patient up to the bathroom. Stating he feels a lot better

## 2023-05-02 NOTE — Progress Notes (Signed)
Redness and whelp resolved and pt was able to rest for a few hours.  He woke up itching in various places (scalp and palms of hands) but no whelps noted.  No shob or swelling of lips/tongue noted.  PRN Benadryl repeated. [name] BSN RN CMSRN 05/02/2023, 5:51 AM

## 2023-05-02 NOTE — Progress Notes (Signed)
Patient stable in the chair. Fluids running. Mom at bedside. Patient states he is much more calmer now. Check MAR for details on medications that were given. Also states he still has some pain in left shoulder. MD/PA are aware

## 2023-05-03 LAB — COMPREHENSIVE METABOLIC PANEL
ALT: 581 U/L — ABNORMAL HIGH (ref 0–44)
AST: 257 U/L — ABNORMAL HIGH (ref 15–41)
Albumin: 3.7 g/dL (ref 3.5–5.0)
Alkaline Phosphatase: 105 U/L (ref 38–126)
Anion gap: 8 (ref 5–15)
BUN: 11 mg/dL (ref 6–20)
CO2: 25 mmol/L (ref 22–32)
Calcium: 8.9 mg/dL (ref 8.9–10.3)
Chloride: 105 mmol/L (ref 98–111)
Creatinine, Ser: 1.11 mg/dL (ref 0.61–1.24)
GFR, Estimated: >60 mL/min (ref >60)
Glucose, Bld: 89 mg/dL (ref 70–99)
Potassium: 3.6 mmol/L (ref 3.5–5.1)
Sodium: 138 mmol/L (ref 135–145)
Total Bilirubin: 0.9 mg/dL (ref 0.3–1.2)
Total Protein: 6.9 g/dL (ref 6.5–8.1)

## 2023-05-03 LAB — CBC
HCT: 44.2 % (ref 39.0–52.0)
Hemoglobin: 14.5 g/dL (ref 13.0–17.0)
MCH: 29.5 pg (ref 26.0–34.0)
MCHC: 32.8 g/dL (ref 30.0–36.0)
MCV: 89.8 fL (ref 80.0–100.0)
Platelets: 248 10*3/uL (ref 150–400)
RBC: 4.92 MIL/uL (ref 4.22–5.81)
RDW: 13.1 % (ref 11.5–15.5)
WBC: 10.9 10*3/uL — ABNORMAL HIGH (ref 4.0–10.5)
nRBC: 0 % (ref 0.0–0.2)

## 2023-05-03 LAB — SURGICAL PATHOLOGY

## 2023-05-03 MED ORDER — IBUPROFEN 200 MG PO TABS: 600.0000 mg | ORAL_TABLET | Freq: Three times a day (TID) | ORAL | 2 refills | Status: AC | PRN

## 2023-05-03 MED ORDER — METHOCARBAMOL 500 MG PO TABS: 500.0000 mg | ORAL_TABLET | Freq: Three times a day (TID) | ORAL | 0 refills | Status: DC | PRN

## 2023-05-03 MED ORDER — TRAMADOL HCL 50 MG PO TABS: 50.0000 mg | ORAL_TABLET | Freq: Four times a day (QID) | ORAL | 0 refills | Status: DC | PRN

## 2023-05-03 MED ORDER — ACETAMINOPHEN 500 MG PO TABS: 1000.0000 mg | ORAL_TABLET | Freq: Four times a day (QID) | ORAL | 0 refills | Status: AC | PRN

## 2023-05-03 NOTE — Discharge Summary (Signed)
Patient ID: Brett Vincent [id number] [birth year] 32 y.o.  Admit date: 04/30/2023 Discharge date: 05/03/2023  Admitting Diagnosis: Acute cholecystitis  Discharge Diagnosis Patient Active Problem List   Diagnosis Date Noted   Acute calculous cholecystitis 04/30/2023   Cholecystitis 04/30/2023   Headache(784.0) 12/19/2013   Syncope and collapse 12/19/2013    Consultants none  Reason for Admission: Pt presented to the ED twice today with chest pain.  He has h/o esophageal spasms and ativan wasn't working for him.  He went to ED yesterday, then to [name] today[facility].  Yesterday had negative CTA chest.   Today labs notable for transaminitis.  Pt had incident last fall with possible pancreatitis associated with a drug trial, but had sludge at the time.  U/s today suspicious for early acute cholecystitis with stones.  He hasn't had n/v.     Of note, patient has h/o schizophrenia that is well treated.   Procedures Lap chole with ICG dye cholangiography, Dr. Cliffton Asters 6/30  Hospital Course:  The patient was admitted and underwent the above procedure.  On POD 1, his TB and AST/ALT went up.  He remained inpatient to follow his labs.  On POD 2, his TB was normal and his AST/ALT were downtrending.  He was able to show me his labs from [facility].  He previously had an episode of pancreatitis with his labs being elevated they way they are now.  It was presumed to be secondary to the trial medication he was on, but given his stones and this situation, it is likely he has been passing some stones as the etiology of his LFT elevation.  His pain is well controlled on POD 2, tolerating a solid diet, and voiding.  He did have an allergic reaction possibly to the CHG wipes.  This resolved.  On POD 2, he was surgically and medically stable for DC home.  Physical Exam: Abd: soft, appropriately tender, ND, incisions c/d/i  Allergies as of 05/03/2023       Reactions   Prednisone Other (See Comments)    agitation        Medication List     TAKE these medications    acetaminophen 500 MG tablet Commonly known as: TYLENOL Take 2 tablets (1,000 mg total) by mouth every 6 (six) hours as needed.   ARIPiprazole 15 MG tablet Commonly known as: ABILIFY Take 15 mg by mouth daily.   benztropine 1 MG tablet Commonly known as: COGENTIN Take 1 mg by mouth 2 (two) times daily.   busPIRone 10 MG tablet Commonly known as: BUSPAR Take 10 mg by mouth 2 (two) times daily.   cetirizine 5 MG tablet Commonly known as: ZYRTEC Take 1 tablet by mouth daily.   hydrOXYzine 25 MG capsule Commonly known as: VISTARIL Take 25 mg by mouth daily as needed for anxiety.   ibuprofen 200 MG tablet Commonly known as: Motrin IB Take 3 tablets (600 mg total) by mouth every 8 (eight) hours as needed.   lithium carbonate 450 MG ER tablet Commonly known as: ESKALITH Take 450 mg by mouth 2 (two) times daily.   LORazepam 2 MG tablet Commonly known as: ATIVAN Take 2 mg by mouth daily as needed for anxiety.   methocarbamol 500 MG tablet Commonly known as: ROBAXIN Take 1 tablet (500 mg total) by mouth every 8 (eight) hours as needed for muscle spasms.   propranolol 10 MG tablet Commonly known as: INDERAL Take 10 mg by mouth 2 (two) times  daily.   sucralfate 1 g tablet Commonly known as: Carafate Take 1 tablet (1 g total) by mouth 4 (four) times daily as needed.   traMADol 50 MG tablet Commonly known as: ULTRAM Take 1 tablet (50 mg total) by mouth every 6 (six) hours as needed (mild pain).          Follow-up Information     Maczis, Hedda Slade, PA-C Follow up on 05/24/2023.   Specialty: General Surgery Why: 9:30 am, Arrive 30 minutes prior to your appointment time, Please bring your insurance card and photo ID Contact information: [address] [place] [address] [facility] [place] [address] [phone]         [facility] Follow up on 05/03/2023.   Why: Please got to  [facility] 7/18 or 7/19 to have your liver tests checked prior to your follow up appointment                Signed: Barnetta Chapel, [facility] 05/03/2023, 9:30 AM Please see Amion for pager number during day hours 7:00am-4:30pm, 7-11:30am on Weekends

## 2023-05-03 NOTE — Plan of Care (Signed)

## 2024-11-30 ENCOUNTER — Ambulatory Visit
Admission: EM | Admit: 2024-11-30 | Discharge: 2024-11-30 | Disposition: A | Discharge status: Home / Self Care | Payer: MEDICAID

## 2024-11-30 ENCOUNTER — Encounter: Payer: Self-pay | Admitting: Emergency Medicine

## 2024-11-30 DIAGNOSIS — M5412 Radiculopathy, cervical region: Secondary | ICD-10-CM

## 2024-11-30 DIAGNOSIS — M25512 Pain in left shoulder: Secondary | ICD-10-CM

## 2024-11-30 MED ORDER — TIZANIDINE HCL 4 MG PO TABS: 4.0000 mg | ORAL_TABLET | Freq: Three times a day (TID) | ORAL | 0 refills | Status: AC | PRN

## 2024-11-30 MED ORDER — METHYLPREDNISOLONE 4 MG PO TBPK: ORAL_TABLET | ORAL | 0 refills | Status: AC

## 2024-11-30 NOTE — ED Provider Notes (Signed)
 " [facility]    CSN: [id number] Arrival date & time: 11/30/24  1334      History   Chief Complaint Chief Complaint  Patient presents with   Shoulder Pain    left    HPI Brett Vincent is a 34 y.o. male presenting for left posterior shoulder pain x 1 week. Denies neck pain but states the pain in the shoulder gets worse when he moves his neck in any direction. No pain radiating to upper extremities, numbness/weakness/tingling. States he has been taking ibuprofen , tylenol  and using heat and ice. No injuries. 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 No history of similar problems.  HPI  Past Medical History:  Diagnosis Date   Headache(784.0)    Kidney stones    Syncope and collapse 12/19/2013    Patient Active Problem List   Diagnosis Date Noted   Acute calculous cholecystitis 04/30/2023   Cholecystitis 04/30/2023   Headache 12/19/2013   Syncope and collapse 12/19/2013    Past Surgical History:  Procedure Laterality Date   CHOLECYSTECTOMY N/A 05/01/2023   Procedure: LAPAROSCOPIC CHOLECYSTECTOMY WITH ICG DYE;  Surgeon: Teresa Lonni HERO, MD;  Location: [facility] OR;  Service: General;  Laterality: N/A;   NO PAST SURGERIES         Home Medications    Prior to Admission medications  Medication Sig Start Date End Date Taking? Authorizing Provider  hydrOXYzine (VISTARIL) 25 MG capsule Take 25 mg by mouth daily as needed for anxiety.   Yes [provider]  methylPREDNISolone  (MEDROL  DOSEPAK) 4 MG TBPK tablet Take po according to dosepack 11/30/24  Yes Arvis Huxley B, PA-C  tiZANidine  (ZANAFLEX ) 4 MG tablet Take 1 tablet (4 mg total) by mouth every 8 (eight) hours as needed for up to 7 days for muscle spasms. 11/30/24 12/07/24 Yes Arvis Huxley NOVAK, PA-C  acetaminophen  (TYLENOL ) 500 MG tablet Take 2 tablets (1,000 mg total) by mouth every 6 (six) hours as needed. 05/03/23   Tammy Sor, PA-C  ARIPiprazole  (ABILIFY ) 15 MG tablet Take 15 mg by mouth  daily. Patient not taking: Reported on 05/01/2023    [provider]  benztropine  (COGENTIN ) 1 MG tablet Take 1 mg by mouth 2 (two) times daily. Patient not taking: Reported on 05/01/2023    [provider]  busPIRone  (BUSPAR ) 10 MG tablet Take 10 mg by mouth 2 (two) times daily. Patient not taking: Reported on 05/01/2023    [provider]  cetirizine (ZYRTEC) 5 MG tablet Take 1 tablet by mouth daily.    [provider]  ibuprofen  (ADVIL ) 200 MG tablet Take 200 mg by mouth.    [provider]  lithium  carbonate (ESKALITH ) 450 MG CR tablet Take 450 mg by mouth 2 (two) times daily. Patient not taking: Reported on 05/01/2023    [provider]  propranolol  (INDERAL ) 10 MG tablet Take 10 mg by mouth 2 (two) times daily. Patient not taking: Reported on 05/01/2023    [provider]    Family History Family History  Problem Relation Age of Onset   Cancer Mother        follicular lymphoma   COPD Father    Heart disease Father     Social History Social History[1]   Allergies   Prednisone   Review of Systems Review of Systems  Musculoskeletal:  Positive for arthralgias. Negative for back pain, joint swelling, neck pain and neck stiffness.  Neurological:  Negative for weakness.  Physical Exam Triage Vital Signs ED Triage Vitals  Encounter Vitals Group     BP      Girls Systolic BP Percentile      Girls Diastolic BP Percentile      Boys Systolic BP Percentile      Boys Diastolic BP Percentile      Pulse      Resp      Temp      Temp src      SpO2      Weight      Height      Head Circumference      Peak Flow      Pain Score      Pain Loc      Pain Education      Exclude from Growth Chart    No data found.  Updated Vital Signs BP 133/85 (BP Location: Right Arm)   Pulse 86   Temp 98.3 F (36.8 C) (Oral)   Resp 15   Ht 5' 10 (1.778 m)   Wt 292 lb (132.5 kg)   SpO2 95%   BMI 41.90 kg/m     Physical Exam Vitals and nursing note reviewed.  Constitutional:      General: He is not in acute distress.    Appearance: Normal appearance. He is well-developed. He is not ill-appearing.  HENT:     Head: Normocephalic and atraumatic.  Eyes:     General: No scleral icterus.    Conjunctiva/sclera: Conjunctivae normal.  Cardiovascular:     Rate and Rhythm: Normal rate and regular rhythm.  Pulmonary:     Effort: Pulmonary effort is normal. No respiratory distress.     Breath sounds: Normal breath sounds.  Musculoskeletal:     Cervical back: Neck supple. No tenderness or bony tenderness. Pain with movement present. Decreased range of motion.       Back:  Skin:    General: Skin is warm and dry.     Capillary Refill: Capillary refill takes less than 2 seconds.  Neurological:     General: No focal deficit present.     Mental Status: He is alert. Mental status is at baseline.     Motor: No weakness.     Gait: Gait normal.  Psychiatric:        Mood and Affect: Mood normal.        Behavior: Behavior normal.      UC Treatments / Results  Labs (all labs ordered are listed, but only abnormal results are displayed) Labs Reviewed - No data to display  EKG   Radiology No results found.  Procedures Procedures (including critical care time)  Medications Ordered in UC Medications - No data to display  Initial Impression / Assessment and Plan / UC Course  I have reviewed the triage vital signs and the nursing notes.  Pertinent labs & imaging results that were available during my care of the patient were reviewed by me and considered in my medical decision making (see chart for details).   34 y/o male presents for pain of the left posterior shoulder x 1 week. Pain worse with moving neck but no pain in neck. No numbness/weakness. No injury. 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 Taking ibuprofen , tylenol  and using heat and ice without relief.  Presentation consistent  with cervical radiculopathy. Treating at this time with medrol  dose pack. Also sent tizanidine . Continue Tylenol , heat, ice. Advised avoiding painful activities. Advised ortho follow up if  not improving in the next couple of weeks or sooner if red flag signs/symptoms.    Final Clinical Impressions(s) / UC Diagnoses   Final diagnoses:  Cervical radiculopathy  Acute pain of left shoulder     Discharge Instructions      NECK PAIN: Stressed avoiding painful activities. This can exacerbate your symptoms and make them worse.  May apply heat to the areas of pain for some relief. Use medications as directed. Be aware of which medications make you drowsy and do not drive or operate any kind of heavy machinery while using the medication (ie pain medications or muscle relaxers). F/U with PCP for reexamination or ortho sooner if condition worsens or does not begin to improve over the next few days.   NECK PAIN RED FLAGS: If symptoms get worse than they are right now, you should come back sooner for re-evaluation. If you have increased numbness/ tingling or notice that the numbness/tingling is affecting the legs or saddle region, go to ER. If you ever lose continence go to ER.      You have a condition requiring you to follow up with Orthopedics so please call one of the following office for appointment:   [facility] Address: [address], [place], [place] [address] Phone: [phone]  [facility] [address] [place], [place] [address] Phone: [phone]  [facility] [address], [place], [place] [address] Phone: [phone]      ED Prescriptions     Medication Sig Dispense Auth. Provider   methylPREDNISolone  (MEDROL  DOSEPAK) 4 MG TBPK tablet Take po according to dosepack 21 tablet Arvis Huxley B, PA-C   tiZANidine  (ZANAFLEX ) 4 MG tablet Take 1 tablet (4 mg total) by mouth every 8 (eight) hours as needed for up to 7 days for muscle spasms. 20 tablet Nakoma Gotwalt B, PA-C       PDMP not reviewed this encounter.     [1]  Social History Tobacco Use   Smoking status: Never   Smokeless tobacco: Never  Vaping Use   Vaping status: Never Used  Substance Use Topics   Alcohol use: No   Drug use: No     Arvis Huxley NOVAK, PA-C 11/30/24 1528  "

## 2024-11-30 NOTE — ED Triage Notes (Signed)
 Patient c/o pain in his left shoulder blade that started a week ago.  Patient states that when he moves his neck or head it makes his pain worse.  Patient unsure of injury.  Patient has tried ice and ibuprofen  with no relief.

## 2024-11-30 NOTE — Discharge Instructions (Signed)
 NECK PAIN: Stressed avoiding painful activities. This can exacerbate your symptoms and make them worse.  May apply heat to the areas of pain for some relief. Use medications as directed. Be aware of which medications make you drowsy and do not drive or operate any kind of heavy machinery while using the medication (ie pain medications or muscle relaxers). F/U with PCP for reexamination or ortho sooner if condition worsens or does not begin to improve over the next few days.   NECK PAIN RED FLAGS: If symptoms get worse than they are right now, you should come back sooner for re-evaluation. If you have increased numbness/ tingling or notice that the numbness/tingling is affecting the legs or saddle region, go to ER. If you ever lose continence go to ER.      You have a condition requiring you to follow up with Orthopedics so please call one of the following office for appointment:   [facility] Address: [address] [place], [place] [address] Phone: [phone]  [facility] [address], [place], [place] [address] Phone: [phone]  [facility] [address], [place], [place] [address] Phone: [phone]

## 2024-12-05 ENCOUNTER — Telehealth: Payer: Self-pay
